# Patient Record
Sex: Female | Born: 1945 | Race: White | Hispanic: No | Marital: Married | Smoking: Never smoker
Health system: Southern US, Community
[De-identification: ages and names within clinical notes are randomized; demographics above are authoritative.]

## PROBLEM LIST (undated history)

## (undated) DIAGNOSIS — G43909 Migraine, unspecified, not intractable, without status migrainosus: Secondary | ICD-10-CM

## (undated) DIAGNOSIS — H269 Unspecified cataract: Secondary | ICD-10-CM

## (undated) DIAGNOSIS — K635 Polyp of colon: Secondary | ICD-10-CM

## (undated) DIAGNOSIS — F32A Depression, unspecified: Secondary | ICD-10-CM

## (undated) DIAGNOSIS — I10 Essential (primary) hypertension: Secondary | ICD-10-CM

## (undated) DIAGNOSIS — G4700 Insomnia, unspecified: Secondary | ICD-10-CM

## (undated) DIAGNOSIS — M797 Fibromyalgia: Secondary | ICD-10-CM

## (undated) DIAGNOSIS — F329 Major depressive disorder, single episode, unspecified: Secondary | ICD-10-CM

## (undated) HISTORY — PX: COLONOSCOPY: SHX174

## (undated) HISTORY — DX: Depression, unspecified: F32.A

## (undated) HISTORY — DX: Unspecified cataract: H26.9

## (undated) HISTORY — DX: Fibromyalgia: M79.7

## (undated) HISTORY — PX: OOPHORECTOMY: SHX86

## (undated) HISTORY — PX: APPENDECTOMY: SHX54

## (undated) HISTORY — DX: Migraine, unspecified, not intractable, without status migrainosus: G43.909

## (undated) HISTORY — DX: Insomnia, unspecified: G47.00

## (undated) HISTORY — DX: Major depressive disorder, single episode, unspecified: F32.9

## (undated) HISTORY — PX: CATARACT EXTRACTION, BILATERAL: SHX1313

---

## 1977-07-14 HISTORY — PX: ABDOMINAL HYSTERECTOMY: SHX81

## 1999-07-01 ENCOUNTER — Encounter: Payer: Self-pay | Admitting: Family Medicine

## 1999-07-01 ENCOUNTER — Encounter: Admission: RE | Admit: 1999-07-01 | Discharge: 1999-07-01 | Payer: Self-pay | Admitting: Family Medicine

## 2000-11-26 ENCOUNTER — Encounter: Admission: RE | Admit: 2000-11-26 | Discharge: 2000-11-26 | Payer: Self-pay | Admitting: Family Medicine

## 2000-11-26 ENCOUNTER — Encounter: Payer: Self-pay | Admitting: Family Medicine

## 2002-07-14 HISTORY — PX: BLADDER SUSPENSION: SHX72

## 2002-11-21 ENCOUNTER — Encounter: Payer: Self-pay | Admitting: Family Medicine

## 2002-11-21 ENCOUNTER — Encounter: Admission: RE | Admit: 2002-11-21 | Discharge: 2002-11-21 | Payer: Self-pay | Admitting: Family Medicine

## 2004-06-17 ENCOUNTER — Encounter (INDEPENDENT_AMBULATORY_CARE_PROVIDER_SITE_OTHER): Payer: Self-pay | Admitting: Specialist

## 2004-06-17 ENCOUNTER — Ambulatory Visit (HOSPITAL_COMMUNITY): Admission: RE | Admit: 2004-06-17 | Discharge: 2004-06-17 | Payer: Self-pay | Admitting: Gastroenterology

## 2004-07-02 ENCOUNTER — Ambulatory Visit (HOSPITAL_COMMUNITY): Admission: RE | Admit: 2004-07-02 | Discharge: 2004-07-02 | Payer: Self-pay | Admitting: Family Medicine

## 2006-01-26 ENCOUNTER — Ambulatory Visit (HOSPITAL_COMMUNITY): Admission: RE | Admit: 2006-01-26 | Discharge: 2006-01-26 | Payer: Self-pay | Admitting: Family Medicine

## 2008-12-06 ENCOUNTER — Emergency Department (HOSPITAL_COMMUNITY): Admission: EM | Admit: 2008-12-06 | Discharge: 2008-12-06 | Payer: Self-pay | Admitting: Emergency Medicine

## 2009-03-14 ENCOUNTER — Encounter: Payer: Self-pay | Admitting: Women's Health

## 2009-03-14 ENCOUNTER — Other Ambulatory Visit: Admission: RE | Admit: 2009-03-14 | Discharge: 2009-03-14 | Payer: Self-pay | Admitting: Obstetrics and Gynecology

## 2009-03-14 ENCOUNTER — Ambulatory Visit (HOSPITAL_COMMUNITY): Admission: RE | Admit: 2009-03-14 | Discharge: 2009-03-14 | Payer: Self-pay | Admitting: Family Medicine

## 2009-03-14 ENCOUNTER — Ambulatory Visit: Payer: Self-pay | Admitting: Women's Health

## 2009-07-23 ENCOUNTER — Ambulatory Visit: Payer: Self-pay | Admitting: Women's Health

## 2010-08-03 ENCOUNTER — Encounter: Payer: Self-pay | Admitting: Family Medicine

## 2010-08-04 ENCOUNTER — Encounter: Payer: Self-pay | Admitting: Family Medicine

## 2010-10-22 LAB — POCT CARDIAC MARKERS
CKMB, poc: <1 ng/mL — ABNORMAL LOW (ref 1.0–8.0)
Myoglobin, poc: 41.5 ng/mL (ref 12–200)

## 2010-10-22 LAB — DIFFERENTIAL
Basophils Absolute: 0 10*3/uL (ref 0.0–0.1)
Lymphocytes Relative: 33 % (ref 12–46)
Monocytes Absolute: 0.7 10*3/uL (ref 0.1–1.0)
Neutro Abs: 4 10*3/uL (ref 1.7–7.7)

## 2010-10-22 LAB — CK TOTAL AND CKMB (NOT AT ARMC)
Relative Index: INVALID (ref 0.0–2.5)
Total CK: 81 U/L (ref 7–177)

## 2010-10-22 LAB — POCT I-STAT, CHEM 8
BUN: 15 mg/dL (ref 6–23)
Chloride: 104 mEq/L (ref 96–112)
Creatinine, Ser: 1.3 mg/dL — ABNORMAL HIGH (ref 0.4–1.2)
Potassium: 4 mEq/L (ref 3.5–5.1)
Sodium: 139 mEq/L (ref 135–145)

## 2010-10-22 LAB — D-DIMER, QUANTITATIVE: D-Dimer, Quant: 0.3 ug/mL-FEU (ref 0.00–0.48)

## 2010-10-22 LAB — HEPATIC FUNCTION PANEL
AST: 48 U/L — ABNORMAL HIGH (ref 0–37)
Albumin: 3.5 g/dL (ref 3.5–5.2)

## 2010-10-22 LAB — CBC
HCT: 43.7 % (ref 36.0–46.0)
Hemoglobin: 15.1 g/dL — ABNORMAL HIGH (ref 12.0–15.0)
MCV: 91.4 fL (ref 78.0–100.0)
Platelets: 231 10*3/uL (ref 150–400)
WBC: 7.1 10*3/uL (ref 4.0–10.5)
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
## 2010-11-29 NOTE — Op Note (Signed)
NAMEJAIANNA, Patricia Orozco              ACCOUNT NO.:  [id number]   MEDICAL RECORD NO.:  [id number]          PATIENT TYPE:  AMB   LOCATION:  ENDO                         FACILITY:  [facility]   PHYSICIAN:  Danise Edge, M.D.   DATE OF BIRTH:  1946/05/04   DATE OF PROCEDURE:  06/17/2004  DATE OF DISCHARGE:                                 OPERATIVE REPORT   PROCEDURE:  Colonoscopy with polypectomy.   INDICATIONS FOR PROCEDURE:  Patricia Orozco is a 65 year old female born  on 08-18-1945.  Patricia Orozco is scheduled to undergo her first  screening colonoscopy with polypectomy to prevent colon cancer.   ENDOSCOPIST:  Danise Edge, M.D.   PREMEDICATION:  Versed 9.5 mg, Demerol 90 mg.   PROCEDURE:  After obtaining informed consent, Patricia Orozco was placed in the  left lateral decubitus position.  I administered intravenous Demerol and  intravenous Versed to achieve conscious sedation for the procedure.  The  patient's blood pressure, oxygen saturation, and cardiac rhythm were  monitored throughout the procedure and documented in the medical record.   Anal inspection and digital rectal exam was normal.  The Olympus adjustable  pediatric colonoscope was introduced into the rectum and advanced to the  cecum.  Colonic preparation for the exam today was excellent.   RECTUM:  Normal.   SIGMOID COLON/DESCENDING COLON:  Normal.   SPLENIC FLEXURE:  Normal.   TRANSVERSE COLON:  Normal.   HEPATIC FLEXURE:  Normal.   ASCENDING COLON:  From the proximal ascending colon, a 2 mm sessile polyp  was removed with electrocautery snare.   CECUM AND ILEOCECAL VALVE:  Normal.   ASSESSMENT:  A small polyp was removed from the proximal ascending colon  with electrocautery snare, otherwise normal screening proctocolonoscopy of  the cecum.   RECOMMENDATIONS:  If the polyp returns neoplastic pathologically, [name] should undergo a repeat colonoscopy in five years.      MJ/MEDQ  D:   06/17/2004  T:  06/17/2004  Job:  [id number]   cc:   Leonette Most Record  [address]  [place]  [place] [address]  Fax: [phone]

## 2011-03-12 ENCOUNTER — Ambulatory Visit (INDEPENDENT_AMBULATORY_CARE_PROVIDER_SITE_OTHER): Payer: Medicare Other | Admitting: Women's Health

## 2011-03-12 ENCOUNTER — Encounter: Payer: Self-pay | Admitting: Women's Health

## 2011-03-12 ENCOUNTER — Other Ambulatory Visit (HOSPITAL_COMMUNITY)
Admission: RE | Admit: 2011-03-12 | Discharge: 2011-03-12 | Disposition: A | Discharge status: Home / Self Care | Payer: Medicare Other | Source: Ambulatory Visit | Attending: Gynecology | Admitting: Gynecology

## 2011-03-12 VITALS — BP 140/70 | Ht 64.0 in | Wt 163.0 lb

## 2011-03-12 DIAGNOSIS — F329 Major depressive disorder, single episode, unspecified: Secondary | ICD-10-CM

## 2011-03-12 DIAGNOSIS — G43909 Migraine, unspecified, not intractable, without status migrainosus: Secondary | ICD-10-CM | POA: Insufficient documentation

## 2011-03-12 DIAGNOSIS — R109 Unspecified abdominal pain: Secondary | ICD-10-CM

## 2011-03-12 DIAGNOSIS — F32A Depression, unspecified: Secondary | ICD-10-CM

## 2011-03-12 DIAGNOSIS — N809 Endometriosis, unspecified: Secondary | ICD-10-CM | POA: Insufficient documentation

## 2011-03-12 DIAGNOSIS — Z01419 Encounter for gynecological examination (general) (routine) without abnormal findings: Secondary | ICD-10-CM | POA: Insufficient documentation

## 2011-03-12 DIAGNOSIS — F418 Other specified anxiety disorders: Secondary | ICD-10-CM | POA: Insufficient documentation

## 2011-03-12 DIAGNOSIS — M797 Fibromyalgia: Secondary | ICD-10-CM | POA: Insufficient documentation

## 2011-03-12 DIAGNOSIS — Z13820 Encounter for screening for osteoporosis: Secondary | ICD-10-CM

## 2011-03-12 DIAGNOSIS — Z124 Encounter for screening for malignant neoplasm of cervix: Secondary | ICD-10-CM

## 2011-03-12 DIAGNOSIS — G4700 Insomnia, unspecified: Secondary | ICD-10-CM | POA: Insufficient documentation

## 2011-03-12 NOTE — Progress Notes (Signed)
CLAUDETT BAYLY [birth year] [id number]    History:    The patient presents for annual exam.  Past medical history, past surgical history, family history and social history were all reviewed and documented in the EPIC chart.   ROS:  A  ROS was performed and pertinent positives and negatives are included in the history.  Exam:  Filed Vitals:   03/12/11 1529  BP: 140/70    General appearance:  Normal Head/Neck:  Normal, without cervical or supraclavicular adenopathy. Thyroid:  Symmetrical, normal in size, without palpable masses or nodularity. Respiratory  Effort:  Normal  Auscultation:  Clear without wheezing or rhonchi Cardiovascular  Auscultation:  Regular rate, without rubs, murmurs or gallops  Edema/varicosities:  Not grossly evident Abdominal  Soft,nontender, without masses, guarding or rebound.  Liver/spleen:  No organomegaly noted  Hernia:  None appreciated  Skin  Inspection:  Grossly normal  Palpation:  Grossly normal Neurologic/psychiatric  Orientation:  Normal with appropriate conversation.  Mood/affect:  Normal  Genitourinary    Breasts: Examined lying and sitting.     Right: Without masses, retractions, discharge or axillary adenopathy.     Left: Without masses, retractions, discharge or axillary adenopathy.   Inguinal/mons:  Normal without inguinal adenopathy  External genitalia:  Normal  BUS/Urethra/Skene's glands:  Normal  Bladder:  Normal  Vagina:  atrophic  Cervix:    Uterus: Absent  Adnexa/parametria:     Rt: Without masses or tenderness.   Lt: Without masses or tenderness.  Anus and perineum: Normal  Digital rectal exam: Normal sphincter tone without palpated masses or tenderness  Assessment/Plan:  65 y.o. MWF G1P51for annual exam.  Hysterectomy with BSO for endometriosis at age 46.Not sexually active. Presents for a Pap and problem. States having  problems with lower abdominal pain, for 2-3 weeks, intermittent, dull ache.  Denies constipation,  urinary pain or frequency, discharge,nausea, vomiting, fever, denies change in diet or routine.History of a normal colonoscopy several years ago. She does have numerous moles and did encourage her to followup with her dermatologist, she states she will get that scheduled. No labs or medications, she gets those at her primary care . She was seen at  her primary care last week, UA was negative, and she also had numerous labs drawn that were all normal(copy brought in and reviewed)  Lower abdominal  pain of unknown origin.  Instructed to followup with her primary care if her pain would persist, encouraged a bland diet, did review the pain is probably not related to a GYN issue Pap only today encouraged SBEs, annual mammogram which have been normal. Dexa, which states was normal with primary care. Zostovac, Pneumovax, and flu vaccine were encouraged.    Harrington Challenger [facility], 5:23 PM 03/12/2011

## 2011-03-13 NOTE — Progress Notes (Signed)
Addended by: Harrington Challenger on: 03/13/2011 06:49 PM   Modules accepted: Orders

## 2011-03-18 ENCOUNTER — Telehealth: Payer: Self-pay | Admitting: Women's Health

## 2011-03-18 NOTE — Telephone Encounter (Signed)
Telephone call from pt.  States received both pnuemovac and zostovac  vaccines last year  (2011) but has not had a bone density.  Will schedule dexa here at [facility].

## 2011-05-23 ENCOUNTER — Emergency Department (HOSPITAL_BASED_OUTPATIENT_CLINIC_OR_DEPARTMENT_OTHER)
Admission: EM | Admit: 2011-05-23 | Discharge: 2011-05-23 | Disposition: A | Discharge status: Home / Self Care | Payer: Medicare Other | Attending: Emergency Medicine | Admitting: Emergency Medicine

## 2011-05-23 ENCOUNTER — Encounter (HOSPITAL_BASED_OUTPATIENT_CLINIC_OR_DEPARTMENT_OTHER): Payer: Self-pay | Admitting: *Deleted

## 2011-05-23 ENCOUNTER — Emergency Department (INDEPENDENT_AMBULATORY_CARE_PROVIDER_SITE_OTHER): Payer: Medicare Other

## 2011-05-23 DIAGNOSIS — Z79899 Other long term (current) drug therapy: Secondary | ICD-10-CM | POA: Insufficient documentation

## 2011-05-23 DIAGNOSIS — M62838 Other muscle spasm: Secondary | ICD-10-CM | POA: Insufficient documentation

## 2011-05-23 DIAGNOSIS — R51 Headache: Secondary | ICD-10-CM | POA: Insufficient documentation

## 2011-05-23 DIAGNOSIS — F329 Major depressive disorder, single episode, unspecified: Secondary | ICD-10-CM | POA: Insufficient documentation

## 2011-05-23 DIAGNOSIS — G319 Degenerative disease of nervous system, unspecified: Secondary | ICD-10-CM | POA: Insufficient documentation

## 2011-05-23 DIAGNOSIS — IMO0001 Reserved for inherently not codable concepts without codable children: Secondary | ICD-10-CM | POA: Insufficient documentation

## 2011-05-23 DIAGNOSIS — I6789 Other cerebrovascular disease: Secondary | ICD-10-CM

## 2011-05-23 DIAGNOSIS — F3289 Other specified depressive episodes: Secondary | ICD-10-CM | POA: Insufficient documentation

## 2011-05-23 MED ORDER — HYDROMORPHONE HCL PF 1 MG/ML IJ SOLN
1.0000 mg | Freq: Once | INTRAMUSCULAR | Status: AC
  Administered 2011-05-23: 1 mg via INTRAVENOUS
  Filled 2011-05-23: qty 1

## 2011-05-23 MED ORDER — METOCLOPRAMIDE HCL 5 MG/ML IJ SOLN
10.0000 mg | Freq: Once | INTRAMUSCULAR | Status: AC
  Administered 2011-05-23: 10 mg via INTRAVENOUS
  Filled 2011-05-23: qty 2

## 2011-05-23 MED ORDER — SODIUM CHLORIDE 0.9 % IV SOLN
Freq: Once | INTRAVENOUS | Status: AC
  Administered 2011-05-23: 15:00:00 via INTRAVENOUS

## 2011-05-23 MED ORDER — METHOCARBAMOL 500 MG PO TABS: 500.0000 mg | ORAL_TABLET | Freq: Two times a day (BID) | ORAL | Status: AC

## 2011-05-23 MED ORDER — HYDROCODONE-ACETAMINOPHEN 5-325 MG PO TABS: 2.0000 | ORAL_TABLET | ORAL | Status: AC | PRN

## 2011-05-23 MED ORDER — DIAZEPAM 5 MG/ML IJ SOLN
5.0000 mg | Freq: Once | INTRAMUSCULAR | Status: AC
  Administered 2011-05-23: 5 mg via INTRAVENOUS
  Filled 2011-05-23: qty 2

## 2011-05-23 MED ORDER — DIPHENHYDRAMINE HCL 50 MG/ML IJ SOLN
12.5000 mg | Freq: Once | INTRAMUSCULAR | Status: AC
  Administered 2011-05-23: 12.5 mg via INTRAVENOUS
  Filled 2011-05-23: qty 1

## 2011-05-23 NOTE — ED Notes (Signed)
Pt awoke yesterday with a migraine that has not responded to her meds or a massage. Pt sts this is the worst HA she's had in 25 years. Pt also c/o N/V.

## 2011-05-23 NOTE — ED Provider Notes (Signed)
History     CSN: [id number] Arrival date & time: 05/23/2011  2:21 PM   First MD Initiated Contact with Patient 05/23/11 1437      Chief Complaint  Patient presents with  . Migraine    (Consider location/radiation/quality/duration/timing/severity/associated sxs/prior treatment) Patient is a 65 y.o. female presenting with headaches. The history is provided by the patient. No language interpreter was used.  Headache  This is a new problem. The current episode started yesterday. The problem occurs constantly. The problem has not changed since onset.The headache is associated with nothing. The pain is located in the bilateral region. The quality of the pain is described as sharp. The pain is at a severity of 10/10. The pain is severe. The pain radiates to the left neck. She has tried triptan therapy and cold packs for the symptoms. The treatment provided no relief.  Pt complains of soreness in her left neck and a headache.  Pt reports she has a history of headaches.  Pt took imitrex yesterday without relief.  Pt reports it hurt to tun neck.  Pt had a sinus headache 3 weeks ago.  Pt reports she received an antibiotic shot and a cortisone shot.   Pt went for a massage to try to help before coming here.  Pt reports no relief from muscle tension or headaache.  Past Medical History  Diagnosis Date  . Fibromyalgia   . Depression   . Insomnia   . Migraines   . Endometriosis     stage 4    Past Surgical History  Procedure Date  . Abdominal hysterectomy 1979    endometrosis  . Bladder suspension 2004  . Oophorectomy   . Appendectomy     Family History  Problem Relation Age of Onset  . Hypertension Mother   . Heart disease Mother     History  Substance Use Topics  . Smoking status: Never Smoker   . Smokeless tobacco: Never Used  . Alcohol Use: No    OB History    Grav Para Term Preterm Abortions TAB SAB Ect Mult Living   1 1        1       Review of Systems  Musculoskeletal:  Positive for back pain.  Neurological: Positive for headaches. Negative for dizziness, weakness, light-headedness and numbness.  All other systems reviewed and are negative.    Allergies  Celebrex  Home Medications   Current Outpatient Rx  Name Route Sig Dispense Refill  . CALCIUM MAGNESIUM PO Oral Take by mouth.      Marland Kitchen KLONOPIN PO Oral Take by mouth as needed.      . DULOXETINE HCL 20 MG PO CPEP Oral Take 60 mg by mouth daily.     Marland Kitchen FISH OIL 1000 MG PO CAPS Oral Take by mouth 2 (two) times daily.      Marland Kitchen OVER THE COUNTER MEDICATION  Baby asprin 81mg      . IMITREX PO Oral Take by mouth.      Marland Kitchen VITAMIN E 400 UNITS PO CAPS Oral Take 400 Units by mouth 2 (two) times daily.      Marland Kitchen ZOLPIDEM TARTRATE 10 MG PO TABS Oral Take 10 mg by mouth at bedtime as needed.        BP 161/76  Pulse 70  Temp(Src) 97.9 F (36.6 C) (Oral)  Resp 18  SpO2 97%  LMP 03/11/1978  Physical Exam  Nursing note and vitals reviewed. Constitutional: She is oriented to person, place, and  time. She appears well-developed and well-nourished.  HENT:  Head: Normocephalic and atraumatic.  Right Ear: External ear normal.  Left Ear: External ear normal.  Mouth/Throat: Oropharynx is clear and moist.  Eyes: Conjunctivae and EOM are normal. Pupils are equal, round, and reactive to light.  Neck: Normal range of motion.  Cardiovascular: Normal rate and normal heart sounds.   Pulmonary/Chest: Effort normal.  Abdominal: Soft.  Musculoskeletal: Normal range of motion.  Neurological: She is alert and oriented to person, place, and time. She has normal reflexes.  Skin: Skin is warm.  Psychiatric: She has a normal mood and affect.    ED Course  Procedures (including critical care time)  Labs Reviewed - No data to display Ct Head Wo Contrast  05/23/2011  *RADIOLOGY REPORT*  Clinical Data: Worst headache of life, light sensitive, migraine  CT HEAD WITHOUT CONTRAST  Technique:  Contiguous axial images were obtained from  the base of the skull through the vertex without contrast.  Comparison: 12/06/2008  Findings: Minimal age-related atrophy. Normal ventricular morphology. No midline shift or mass effect. Scattered small vessel chronic ischemic changes of deep cerebral white matter, stable. No intracranial hemorrhage, mass lesion or evidence of acute infarction. Visualized paranasal sinuses and mastoid air cells clear. No acute osseous findings.  IMPRESSION: Small vessel chronic ischemic changes of deep cerebral white matter. No acute intracranial abnormalities.  Original Report Authenticated By: Lollie Marrow, M.D.     No diagnosis found.    MDM  IV Ns x 999,  Pt given torodol, reglan and benadryl  Head Ct is normal.   Dr. Anitra Lauth in to see,  Pt given dilaudid and valium.  Pt has some decreased spasm left neck.  No evidence of cva,  Headache seems more tension, muscular,  Neck muscle spasm,   I do not think pt has SAH.   Pt advised to see her MD on Monday.        Langston Masker, [place] 05/23/11 [phone]

## 2011-05-24 NOTE — ED Provider Notes (Signed)
Medical screening examination/treatment/procedure(s) were performed by non-physician practitioner and as supervising physician I was immediately available for consultation/collaboration.   Celene Kras, MD 05/24/11 [phone]

## 2013-04-27 ENCOUNTER — Other Ambulatory Visit (HOSPITAL_COMMUNITY): Payer: Self-pay | Admitting: Bariatrics

## 2013-04-27 DIAGNOSIS — Z1231 Encounter for screening mammogram for malignant neoplasm of breast: Secondary | ICD-10-CM

## 2013-05-09 ENCOUNTER — Ambulatory Visit (HOSPITAL_COMMUNITY): Payer: Medicare Other

## 2013-05-23 ENCOUNTER — Ambulatory Visit (HOSPITAL_COMMUNITY): Payer: Medicare Other

## 2014-05-15 ENCOUNTER — Encounter (HOSPITAL_BASED_OUTPATIENT_CLINIC_OR_DEPARTMENT_OTHER): Payer: Self-pay | Admitting: *Deleted

## 2016-09-11 ENCOUNTER — Other Ambulatory Visit: Payer: Self-pay | Admitting: Family Medicine

## 2016-09-11 DIAGNOSIS — Z1231 Encounter for screening mammogram for malignant neoplasm of breast: Secondary | ICD-10-CM

## 2016-09-30 ENCOUNTER — Ambulatory Visit: Payer: Self-pay

## 2016-10-16 ENCOUNTER — Ambulatory Visit: Payer: Self-pay

## 2017-06-24 ENCOUNTER — Ambulatory Visit (HOSPITAL_COMMUNITY): Payer: Self-pay | Admitting: Psychiatry

## 2017-08-05 ENCOUNTER — Ambulatory Visit (HOSPITAL_COMMUNITY): Payer: Self-pay | Admitting: Psychiatry

## 2017-10-24 ENCOUNTER — Emergency Department (HOSPITAL_COMMUNITY): Payer: Medicare HMO

## 2017-10-24 ENCOUNTER — Other Ambulatory Visit: Payer: Self-pay

## 2017-10-24 ENCOUNTER — Observation Stay (HOSPITAL_COMMUNITY): Payer: Medicare HMO

## 2017-10-24 ENCOUNTER — Observation Stay (HOSPITAL_COMMUNITY)
Admission: EM | Admit: 2017-10-24 | Discharge: 2017-10-26 | Disposition: A | Discharge status: Home / Self Care | Payer: Medicare HMO | Attending: Internal Medicine | Admitting: Internal Medicine

## 2017-10-24 ENCOUNTER — Encounter (HOSPITAL_COMMUNITY): Payer: Self-pay | Admitting: Emergency Medicine

## 2017-10-24 DIAGNOSIS — R531 Weakness: Secondary | ICD-10-CM | POA: Insufficient documentation

## 2017-10-24 DIAGNOSIS — Z961 Presence of intraocular lens: Secondary | ICD-10-CM | POA: Insufficient documentation

## 2017-10-24 DIAGNOSIS — G4700 Insomnia, unspecified: Secondary | ICD-10-CM | POA: Diagnosis not present

## 2017-10-24 DIAGNOSIS — R42 Dizziness and giddiness: Secondary | ICD-10-CM

## 2017-10-24 DIAGNOSIS — I10 Essential (primary) hypertension: Secondary | ICD-10-CM | POA: Insufficient documentation

## 2017-10-24 DIAGNOSIS — E861 Hypovolemia: Secondary | ICD-10-CM | POA: Diagnosis not present

## 2017-10-24 DIAGNOSIS — Z90710 Acquired absence of both cervix and uterus: Secondary | ICD-10-CM | POA: Insufficient documentation

## 2017-10-24 DIAGNOSIS — G43909 Migraine, unspecified, not intractable, without status migrainosus: Secondary | ICD-10-CM | POA: Insufficient documentation

## 2017-10-24 DIAGNOSIS — Z9841 Cataract extraction status, right eye: Secondary | ICD-10-CM | POA: Insufficient documentation

## 2017-10-24 DIAGNOSIS — Z86010 Personal history of colonic polyps: Secondary | ICD-10-CM | POA: Insufficient documentation

## 2017-10-24 DIAGNOSIS — N179 Acute kidney failure, unspecified: Secondary | ICD-10-CM | POA: Diagnosis not present

## 2017-10-24 DIAGNOSIS — R4701 Aphasia: Secondary | ICD-10-CM | POA: Diagnosis not present

## 2017-10-24 DIAGNOSIS — Z9842 Cataract extraction status, left eye: Secondary | ICD-10-CM | POA: Diagnosis not present

## 2017-10-24 DIAGNOSIS — M797 Fibromyalgia: Secondary | ICD-10-CM | POA: Diagnosis not present

## 2017-10-24 DIAGNOSIS — Z881 Allergy status to other antibiotic agents status: Secondary | ICD-10-CM | POA: Diagnosis not present

## 2017-10-24 DIAGNOSIS — N289 Disorder of kidney and ureter, unspecified: Secondary | ICD-10-CM | POA: Diagnosis not present

## 2017-10-24 DIAGNOSIS — Z8249 Family history of ischemic heart disease and other diseases of the circulatory system: Secondary | ICD-10-CM | POA: Diagnosis not present

## 2017-10-24 DIAGNOSIS — Z79899 Other long term (current) drug therapy: Secondary | ICD-10-CM | POA: Diagnosis not present

## 2017-10-24 DIAGNOSIS — J8410 Pulmonary fibrosis, unspecified: Secondary | ICD-10-CM | POA: Insufficient documentation

## 2017-10-24 DIAGNOSIS — F418 Other specified anxiety disorders: Secondary | ICD-10-CM

## 2017-10-24 DIAGNOSIS — I071 Rheumatic tricuspid insufficiency: Secondary | ICD-10-CM | POA: Diagnosis not present

## 2017-10-24 DIAGNOSIS — R55 Syncope and collapse: Principal | ICD-10-CM | POA: Insufficient documentation

## 2017-10-24 DIAGNOSIS — Z7982 Long term (current) use of aspirin: Secondary | ICD-10-CM | POA: Diagnosis not present

## 2017-10-24 DIAGNOSIS — Z886 Allergy status to analgesic agent status: Secondary | ICD-10-CM | POA: Insufficient documentation

## 2017-10-24 DIAGNOSIS — R079 Chest pain, unspecified: Secondary | ICD-10-CM | POA: Insufficient documentation

## 2017-10-24 HISTORY — DX: Polyp of colon: K63.5

## 2017-10-24 HISTORY — DX: Essential (primary) hypertension: I10

## 2017-10-24 LAB — COMPREHENSIVE METABOLIC PANEL
ALBUMIN: 3.5 g/dL (ref 3.5–5.0)
ALK PHOS: 73 U/L (ref 38–126)
ALT: 32 U/L (ref 14–54)
AST: 30 U/L (ref 15–41)
Anion gap: 12 (ref 5–15)
BILIRUBIN TOTAL: 0.8 mg/dL (ref 0.3–1.2)
BUN: 29 mg/dL — AB (ref 6–20)
CALCIUM: 9.4 mg/dL (ref 8.9–10.3)
CO2: 22 mmol/L (ref 22–32)
CREATININE: 1.35 mg/dL — AB (ref 0.44–1.00)
Chloride: 100 mmol/L — ABNORMAL LOW (ref 101–111)
GFR calc Af Amer: 44 mL/min — ABNORMAL LOW (ref >60)
GFR calc non Af Amer: 38 mL/min — ABNORMAL LOW (ref >60)
GLUCOSE: 96 mg/dL (ref 65–99)
Potassium: 3.7 mmol/L (ref 3.5–5.1)
Sodium: 134 mmol/L — ABNORMAL LOW (ref 135–145)
TOTAL PROTEIN: 6.1 g/dL — AB (ref 6.5–8.1)

## 2017-10-24 LAB — URINALYSIS, ROUTINE W REFLEX MICROSCOPIC
BACTERIA UA: NONE SEEN
BILIRUBIN URINE: NEGATIVE
Glucose, UA: NEGATIVE mg/dL
Hgb urine dipstick: NEGATIVE
Ketones, ur: 20 mg/dL — AB
Nitrite: NEGATIVE
Protein, ur: NEGATIVE mg/dL
SPECIFIC GRAVITY, URINE: 1.011 (ref 1.005–1.030)
pH: 7 (ref 5.0–8.0)

## 2017-10-24 LAB — RAPID URINE DRUG SCREEN, HOSP PERFORMED
AMPHETAMINES: NOT DETECTED
Barbiturates: NOT DETECTED
Benzodiazepines: NOT DETECTED
COCAINE: NOT DETECTED
OPIATES: NOT DETECTED
TETRAHYDROCANNABINOL: NOT DETECTED

## 2017-10-24 LAB — CBC WITH DIFFERENTIAL/PLATELET
BASOS ABS: 0 10*3/uL (ref 0.0–0.1)
BASOS PCT: 0 %
Eosinophils Absolute: 0.1 10*3/uL (ref 0.0–0.7)
Eosinophils Relative: 1 %
HEMATOCRIT: 45.9 % (ref 36.0–46.0)
HEMOGLOBIN: 16.2 g/dL — AB (ref 12.0–15.0)
LYMPHS PCT: 29 %
Lymphs Abs: 3.5 10*3/uL (ref 0.7–4.0)
MCH: 31.8 pg (ref 26.0–34.0)
MCHC: 35.3 g/dL (ref 30.0–36.0)
MCV: 90.2 fL (ref 78.0–100.0)
MONOS PCT: 6 %
Monocytes Absolute: 0.8 10*3/uL (ref 0.1–1.0)
NEUTROS ABS: 7.5 10*3/uL (ref 1.7–7.7)
NEUTROS PCT: 64 %
Platelets: 233 10*3/uL (ref 150–400)
RBC: 5.09 MIL/uL (ref 3.87–5.11)
RDW: 12.5 % (ref 11.5–15.5)
WBC: 11.9 10*3/uL — ABNORMAL HIGH (ref 4.0–10.5)

## 2017-10-24 LAB — I-STAT TROPONIN, ED: Troponin i, poc: 0.01 ng/mL (ref 0.00–0.08)

## 2017-10-24 LAB — CBG MONITORING, ED: Glucose-Capillary: 67 mg/dL (ref 65–99)

## 2017-10-24 MED ORDER — LORAZEPAM 0.5 MG PO TABS
0.5000 mg | ORAL_TABLET | Freq: Four times a day (QID) | ORAL | Status: DC | PRN
  Administered 2017-10-25: 0.5 mg via ORAL
  Filled 2017-10-24: qty 1

## 2017-10-24 MED ORDER — ONDANSETRON HCL 4 MG/2ML IJ SOLN
4.0000 mg | Freq: Once | INTRAMUSCULAR | Status: AC
  Administered 2017-10-24: 4 mg via INTRAVENOUS
  Filled 2017-10-24: qty 2

## 2017-10-24 MED ORDER — ASPIRIN 81 MG PO CHEW: 324.0000 mg | CHEWABLE_TABLET | Freq: Once | ORAL | Status: DC

## 2017-10-24 MED ORDER — SODIUM CHLORIDE 0.9 % IV BOLUS
1000.0000 mL | Freq: Once | INTRAVENOUS | Status: AC
  Administered 2017-10-24: 1000 mL via INTRAVENOUS

## 2017-10-24 MED ORDER — DULOXETINE HCL 60 MG PO CPEP
60.0000 mg | ORAL_CAPSULE | Freq: Every day | ORAL | Status: DC
  Administered 2017-10-24 – 2017-10-26 (×3): 60 mg via ORAL
  Filled 2017-10-24 (×3): qty 1

## 2017-10-24 MED ORDER — SODIUM CHLORIDE 0.9% FLUSH
3.0000 mL | Freq: Two times a day (BID) | INTRAVENOUS | Status: DC
  Administered 2017-10-24 – 2017-10-26 (×3): 3 mL via INTRAVENOUS

## 2017-10-24 MED ORDER — ASPIRIN 81 MG PO CHEW
81.0000 mg | CHEWABLE_TABLET | Freq: Every day | ORAL | Status: DC
  Administered 2017-10-25 – 2017-10-26 (×2): 81 mg via ORAL
  Filled 2017-10-24 (×2): qty 1

## 2017-10-24 MED ORDER — HEPARIN SODIUM (PORCINE) 5000 UNIT/ML IJ SOLN
5000.0000 [IU] | Freq: Three times a day (TID) | INTRAMUSCULAR | Status: DC
  Administered 2017-10-24 – 2017-10-26 (×5): 5000 [IU] via SUBCUTANEOUS
  Filled 2017-10-24 (×5): qty 1

## 2017-10-24 MED ORDER — LORAZEPAM 2 MG/ML IJ SOLN
1.0000 mg | Freq: Once | INTRAMUSCULAR | Status: AC
  Administered 2017-10-24: 1 mg via INTRAVENOUS
  Filled 2017-10-24: qty 1

## 2017-10-24 MED ORDER — ONDANSETRON HCL 4 MG PO TABS: 4.0000 mg | ORAL_TABLET | Freq: Four times a day (QID) | ORAL | Status: DC | PRN

## 2017-10-24 MED ORDER — ACETAMINOPHEN 325 MG PO TABS
650.0000 mg | ORAL_TABLET | Freq: Four times a day (QID) | ORAL | Status: DC | PRN
  Administered 2017-10-25: 650 mg via ORAL
  Filled 2017-10-24: qty 2

## 2017-10-24 MED ORDER — HYDROCODONE-ACETAMINOPHEN 5-325 MG PO TABS
1.0000 | ORAL_TABLET | ORAL | Status: DC | PRN
  Administered 2017-10-26: 2 via ORAL
  Filled 2017-10-24: qty 2

## 2017-10-24 MED ORDER — ONDANSETRON HCL 4 MG/2ML IJ SOLN: 4.0000 mg | Freq: Four times a day (QID) | INTRAMUSCULAR | Status: DC | PRN

## 2017-10-24 MED ORDER — ACETAMINOPHEN 650 MG RE SUPP: 650.0000 mg | Freq: Four times a day (QID) | RECTAL | Status: DC | PRN

## 2017-10-24 MED ORDER — OMEGA-3-ACID ETHYL ESTERS 1 G PO CAPS
1.0000 g | ORAL_CAPSULE | Freq: Two times a day (BID) | ORAL | Status: DC
  Administered 2017-10-25 – 2017-10-26 (×3): 1 g via ORAL
  Filled 2017-10-24 (×3): qty 1

## 2017-10-24 MED ORDER — ZOLPIDEM TARTRATE 5 MG PO TABS
5.0000 mg | ORAL_TABLET | Freq: Every evening | ORAL | Status: DC | PRN
  Administered 2017-10-24 – 2017-10-25 (×2): 5 mg via ORAL
  Filled 2017-10-24 (×2): qty 1

## 2017-10-24 MED ORDER — SODIUM CHLORIDE 0.9 % IV SOLN
INTRAVENOUS | Status: AC
  Administered 2017-10-24: via INTRAVENOUS

## 2017-10-24 NOTE — ED Triage Notes (Signed)
Per [facility]: Patient to ED c/o near-syncopal episode. Patient and family at side report patient has dealt with these episodes for a while, but have increased in frequency and length the past two weeks. During the episodes, patient becomes dizzy and diaphoretic and states, "I feel like I'm about to pass out." Upon EMS arrival, patient denied CP - en route, she endorsed CP, at which time, she also became nauseated and short of breath. Patient A&O x 4. She had a full work-up per family on Wednesday and state they thought everything came back normal. EMS VS: 130/90, HR 60 NSR, 97% RA, CBG 120. Patient given 324 ASA and 1 SL NTG PTA with relief - denies CP at this time. Resp currently e/u, skin cool all extremities, slightly mottled.

## 2017-10-24 NOTE — ED Notes (Signed)
Family stepped to nurses desk and stated that her mother had a sudden change and needed help. MD was at bedside. RN step in and was there for support. Covered patient up and readjusted patient in the bed. NAD Noted.

## 2017-10-24 NOTE — ED Provider Notes (Addendum)
[facility]   CSN: [id number] Arrival date & time: 10/24/17  1737     History   Chief Complaint Chief Complaint  Patient presents with  . Near Syncope    HPI Patricia Orozco is a 72 y.o. female.  Patient with multiple episodes of dizziness, nausea, diaphoresis, palpitations, near syncope for the last several days.  Patient states some chest pain.  No shortness of breath, possibly some dysuria.  Patient states that symptoms typically resolve on their own with rest.  Patient has been eating and drinking well.  No new medications.  No fever, no cough, no sputum production.  The history is provided by the patient, a caregiver and a relative.  Near Syncope  This is a recurrent problem. The current episode started more than 2 days ago. The problem occurs daily. The problem has been gradually worsening. Associated symptoms include chest pain. Pertinent negatives include no abdominal pain, no headaches and no shortness of breath. Nothing aggravates the symptoms. The symptoms are relieved by rest. She has tried nothing for the symptoms. The treatment provided no relief.    Past Medical History:  Diagnosis Date  . Colon polyp   . Depression   . Endometriosis    stage 4  . Fibromyalgia   . Hypertension   . Insomnia   . Migraines     Patient Active Problem List   Diagnosis Date Noted  . Near syncope 10/24/2017  . Chest pain 10/24/2017  . Expressive aphasia 10/24/2017  . Endometriosis 03/12/2011  . Depression with anxiety 03/12/2011  . Insomnia   . Migraines     Past Surgical History:  Procedure Laterality Date  . ABDOMINAL HYSTERECTOMY  1979   endometrosis  . APPENDECTOMY    . BLADDER SUSPENSION  2004  . COLONOSCOPY    . OOPHORECTOMY       OB History    Gravida  1   Para  1   Term      Preterm      AB      Living  1     SAB      TAB      Ectopic      Multiple      Live Births                Home Medications    Prior to Admission medications   Medication Sig Start Date End Date Taking? Authorizing Provider  Calcium-Magnesium-Vitamin D (CALCIUM MAGNESIUM PO) Take by mouth.      [provider]  ClonazePAM (KLONOPIN PO) Take by mouth as needed.      [provider]  DULoxetine (CYMBALTA) 20 MG capsule Take 60 mg by mouth daily.     [provider]  Omega-3 Fatty Acids (FISH OIL) 1000 MG CAPS Take by mouth 2 (two) times daily.      [provider]  OVER THE COUNTER MEDICATION Baby asprin 81mg      [provider]  SUMAtriptan Succinate (IMITREX PO) Take by mouth.      [provider]  vitamin E (VITAMIN E) 400 UNIT capsule Take 400 Units by mouth 2 (two) times daily.      [provider]  zolpidem (AMBIEN) 10 MG tablet Take 10 mg by mouth at bedtime as needed.      [provider]    Family History Family History  Problem Relation Age of Onset  . Hypertension Mother   .  Heart disease Mother     Social History Social History   Tobacco Use  . Smoking status: Never Smoker  . Smokeless tobacco: Never Used  Substance Use Topics  . Alcohol use: No  . Drug use: No     Allergies   Celebrex [celecoxib]   Review of Systems Review of Systems  Constitutional: Positive for diaphoresis. Negative for chills and fever.  HENT: Negative for ear pain and sore throat.   Eyes: Negative for pain and visual disturbance.  Respiratory: Negative for cough and shortness of breath.   Cardiovascular: Positive for chest pain, palpitations and near-syncope.  Gastrointestinal: Positive for nausea. Negative for abdominal pain and vomiting.  Genitourinary: Positive for difficulty urinating and dysuria. Negative for hematuria.  Musculoskeletal: Negative for arthralgias and back pain.  Skin: Negative for color change and rash.  Neurological: Positive for dizziness, syncope (near) and weakness. Negative for seizures  and headaches.  All other systems reviewed and are negative.    Physical Exam Updated Vital Signs  ED Triage Vitals  Enc Vitals Group     BP 10/24/17 1747 (!) 145/56     Pulse Rate 10/24/17 1747 (!) 58     Resp 10/24/17 1747 18     Temp 10/24/17 1747 97.9 F (36.6 C)     Temp Source 10/24/17 1747 Oral     SpO2 10/24/17 1747 100 %     Weight --      Height --      Head Circumference --      Peak Flow --      Pain Score 10/24/17 1801 0     Pain Loc --      Pain Edu? --      Excl. in GC? --     Physical Exam  Constitutional: She is oriented to person, place, and time. She appears well-developed and well-nourished. She appears distressed.  HENT:  Head: Normocephalic and atraumatic.  Eyes: Pupils are equal, round, and reactive to light. Conjunctivae and EOM are normal.  Neck: Normal range of motion. Neck supple.  Cardiovascular: Normal rate and regular rhythm.  No murmur heard. Pulmonary/Chest: Effort normal and breath sounds normal. No respiratory distress.  Abdominal: Soft. There is no tenderness.  Musculoskeletal: Normal range of motion. She exhibits no edema.  Neurological: She is alert and oriented to person, place, and time. No cranial nerve deficit or sensory deficit. She exhibits normal muscle tone. Coordination normal.  5+/5 strength, normal sensation, no drift  Skin: Skin is warm and dry.  Psychiatric: She has a normal mood and affect.  Nursing note and vitals reviewed.    ED Treatments / Results  Labs (all labs ordered are listed, but only abnormal results are displayed) Labs Reviewed  CBC WITH DIFFERENTIAL/PLATELET - Abnormal; Notable for the following components:      Result Value   WBC 11.9 (*)    Hemoglobin 16.2 (*)    All other components within normal limits  COMPREHENSIVE METABOLIC PANEL - Abnormal; Notable for the following components:   Sodium 134 (*)    Chloride 100 (*)    BUN 29 (*)    Creatinine, Ser 1.35 (*)    Total Protein 6.1 (*)     GFR calc non Af Amer 38 (*)    GFR calc Af Amer 44 (*)    All other components within normal limits  URINALYSIS, ROUTINE W REFLEX MICROSCOPIC - Abnormal; Notable for the following components:   APPearance HAZY (*)  Ketones, ur 20 (*)    Leukocytes, UA SMALL (*)    Squamous Epithelial / LPF 0-5 (*)    All other components within normal limits  RAPID URINE DRUG SCREEN, HOSP PERFORMED  CBG MONITORING, ED  I-STAT TROPONIN, ED    EKG EKG Interpretation  Date/Time:  Saturday October 24 2017 17:47:14 EDT Ventricular Rate:  58 PR Interval:    QRS Duration: 95 QT Interval:  466 QTC Calculation: 458 R Axis:   42 Text Interpretation:  Sinus rhythm Borderline short PR interval Anteroseptal infarct, age indeterminate Lateral leads are also involved Confirmed by Blane Ohara [phone]) on 10/24/2017 5:49:50 PM   Radiology Dg Chest 1 View  Result Date: 10/24/2017 CLINICAL DATA:  Syncope with shortness of breath EXAM: CHEST  1 VIEW COMPARISON:  Dec 06, 2008 FINDINGS: There is a calcified granuloma in the right mid lung, stable. There is no edema or consolidation. The heart size and pulmonary vascularity are normal. No adenopathy. No bone lesions. IMPRESSION: Calcified granuloma right mid lung. No edema or consolidation. Stable cardiac silhouette. Electronically Signed   By: Bretta Bang III M.D.   On: 10/24/2017 18:05   Ct Head Code Stroke Wo Contrast`  Result Date: 10/24/2017 CLINICAL DATA:  Code stroke. Dizziness, aphasia, and bilateral weakness. EXAM: CT HEAD WITHOUT CONTRAST TECHNIQUE: Contiguous axial images were obtained from the base of the skull through the vertex without intravenous contrast. COMPARISON:  05/23/2011 FINDINGS: Brain: No evidence of acute infarction, hemorrhage, hydrocephalus, extra-axial collection or mass lesion/mass effect. Moderate patchy low-density in the cerebral white matter best attributed to chronic small vessel ischemia. Vascular: Atherosclerotic calcification.   No hyperdense vessel. Skull: Normal. Negative for fracture or focal lesion. Sinuses/Orbits: Bilateral cataract resection.  No acute finding. Other: These results were communicated to Dr. Laurence Slate at 7:50 pmon 4/13/2019by text page via the [facility] messaging system. ASPECTS [facility] Stroke Program Early CT Score) Not scored with this history. IMPRESSION: 1. No acute finding. 2. Moderate chronic small vessel ischemia. Electronically Signed   By: Marnee Spring M.D.   On: 10/24/2017 19:51    Procedures Procedures (including critical care time)  Medications Ordered in ED Medications  sodium chloride 0.9 % bolus 1,000 mL (1,000 mLs Intravenous New Bag/Given 10/24/17 1848)  ondansetron (ZOFRAN) injection 4 mg (4 mg Intravenous Given 10/24/17 1855)     Initial Impression / Assessment and Plan / ED Course  I have reviewed the triage vital signs and the nursing notes.  Pertinent labs & imaging results that were available during my care of the patient were reviewed by me and considered in my medical decision making (see chart for details).     Patricia Orozco is a 72 year old female with history of fibromyalgia, depression who presents to the ED with near syncopal events.  Patient with overall unremarkable vitals.  No fever.  Patient with multiple episodes of dizziness, diaphoresis, near syncope over the last several days.  Patient states that she feels palpitations and possibly some chest pain during these events.  Last event happened this morning she felt like she was going to pass out but did not.  She felt like her heart rate was high. Patient does not have any known cholesterol issues or diabetes.  She does have hypertension.  EKG done in triage shows T wave inversions in V1 through V6 concerning for ischemia.  Otherwise there is no prior EKG to compare to.  Patient appears neurologically intact.  Exam is overall unremarkable.  Will obtain troponin, x-ray  of her chest and urinalysis and basic labs.   Concern for arrhythmia versus ACS versus electrolyte abnormality.  Patient with normal vitals.  Has a history of anxiety and used to be on benzodiazepines but patient states that she has not been on any.  Patient with chest x-ray that showed no signs of pneumonia, pneumothorax, pleural effusion.  Troponin within normal limits.  No signs of urinary tract infection.  No significant anemia or electrolyte abnormality.  No leukocytosis.  Patient with no episodes of arrhythmias while in the ED.  Patient however did have a acute episode of aphasia and extremity weakness.  Concern for stroke and a code stroke was called.  Patient had a head CT that was overall unremarkable.  Neurology recommends getting an MRI but after further evaluation suspect likely some anxiety component to these episodes.  However, given EKG findings will admit the patient for MRI, further cardiac monitoring, echocardiogram likely.  Patient remained hemodynamically stable throughout my care.  Final Clinical Impressions(s) / ED Diagnoses   Final diagnoses:  Near syncope  Dizziness  Weakness    ED Discharge Orders    None       Virgina Norfolk, DO 10/24/17 1745    Virgina Norfolk, DO 10/24/17 2055    Blane Ohara, MD 10/25/17 [phone]

## 2017-10-24 NOTE — ED Notes (Signed)
RN Aram BeechamCynthia called CT and pt is being moved to top of list.

## 2017-10-24 NOTE — ED Notes (Signed)
CT notified of Code stroke called

## 2017-10-24 NOTE — H&P (Signed)
History and Physical    Patricia DoveSandra L Orozco XLK:[id number] DOB: 1946-04-03 DOA: 10/24/2017  PCP: Corinna CapraBrown, Angel, DO   Patient coming from: Home  Chief Complaint: Near-syncope   HPI: Patricia DoveSandra L Orozco is a 72 y.o. female with medical history significant for depression with anxiety, hypertension, and fibromyalgia, now presenting to the emergency department for evaluation of near syncopal episodes.  Patient is accompanied by family who assist with the history.  She reportedly been in her usual state until approximately 1 week ago when she had an episode of near syncope.  She has had increasingly frequent episodes since that, including multiple today.  These episodes have occurred while seated and while lying down and are described as diaphoresis, followed by acute dyspnea, and then lightheadedness as though she is about to pass out.  She has not lost consciousness with these episodes.  There is no recent fall or trauma reported.  She denies headache, change in vision or hearing, or focal numbness or weakness.  ED Course: Upon arrival to the ED, patient is found to be afebrile, saturating well on room air, and with vitals otherwise stable.  EKG features a sinus rhythm with anterolateral ST-T abnormalities.  Noncontrast head CT is negative for acute intracranial abnormality.  Chest x-ray features a calcified granuloma in the right mid lung, but otherwise negative.  CBC is notable for mild leukocytosis and polycythemia.  Urinalysis is unremarkable and troponin is normal.  Chemistry panel features a creatinine of 1.35, up from 0.90 last October.  Patient was given a liter of normal saline and Zofran in the ED.  While in the ED, she had an acute episode of expressive aphasia, describing that she was trying to talk but was unable to say anything.  Code stroke was called and she was evaluated by neurology.  She remains hemodynamically stable, and no apparent respiratory distress, the acute aphasia has resolved, and she  will be observed on telemetry unit.  Review of Systems:  All other systems reviewed and apart from HPI, are negative.  Past Medical History:  Diagnosis Date  . Colon polyp   . Depression   . Endometriosis    stage 4  . Fibromyalgia   . Hypertension   . Insomnia   . Migraines     Past Surgical History:  Procedure Laterality Date  . ABDOMINAL HYSTERECTOMY  1979   endometrosis  . APPENDECTOMY    . BLADDER SUSPENSION  2004  . COLONOSCOPY    . OOPHORECTOMY       reports that she has never smoked. She has never used smokeless tobacco. She reports that she does not drink alcohol or use drugs.  Allergies  Allergen Reactions  . Celebrex [Celecoxib] Itching    Family History  Problem Relation Age of Onset  . Hypertension Mother   . Heart disease Mother      Prior to Admission medications   Medication Sig Start Date End Date Taking? Authorizing Provider  Calcium-Magnesium-Vitamin D (CALCIUM MAGNESIUM PO) Take by mouth.      [provider]  ClonazePAM (KLONOPIN PO) Take by mouth as needed.      [provider]  DULoxetine (CYMBALTA) 20 MG capsule Take 60 mg by mouth daily.     [provider]  Omega-3 Fatty Acids (FISH OIL) 1000 MG CAPS Take by mouth 2 (two) times daily.      [provider]  OVER THE COUNTER MEDICATION Baby asprin 81mg      [provider]  SUMAtriptan Succinate (IMITREX PO) Take by mouth.      [provider]  vitamin E (VITAMIN E) 400 UNIT capsule Take 400 Units by mouth 2 (two) times daily.      [provider]  zolpidem (AMBIEN) 10 MG tablet Take 10 mg by mouth at bedtime as needed.      [provider]    Physical Exam: Vitals:   10/24/17 1900 10/24/17 1905 10/24/17 1915 10/24/17 2100  BP:  (!) 160/64 (!) 155/73 (!) 154/82  Pulse: (!) 58 (!) 58 61 68  Resp: 16 16 19  (!) 24  Temp:      TempSrc:      SpO2: 100% 100% 100% 98%      Constitutional: NAD, anxious Eyes:  PERTLA, lids and conjunctivae normal ENMT: Mucous membranes are moist. Posterior pharynx clear of any exudate or lesions.   Neck: normal, supple, no masses, no thyromegaly Respiratory: clear to auscultation bilaterally, no wheezing, no crackles. Normal respiratory effort.    Cardiovascular: S1 & S2 heard, regular rate and rhythm. No significant JVD. Abdomen: No distension, no tenderness, soft. Bowel sounds normal.  Musculoskeletal: no clubbing / cyanosis. No joint deformity upper and lower extremities.    Skin: no significant rashes, lesions, ulcers. Warm, dry, well-perfused. Neurologic: CN 2-12 grossly intact. Sensation intact, patellar DTR normal. Strength 5/5 in all 4 limbs.  Psychiatric: Alert and oriented x 3. Anxious, cooperative.     Labs on Admission: I have personally reviewed following labs and imaging studies  CBC: Recent Labs  Lab 10/24/17 1751  WBC 11.9*  NEUTROABS 7.5  HGB 16.2*  HCT 45.9  MCV 90.2  PLT 233   Basic Metabolic Panel: Recent Labs  Lab 10/24/17 1751  NA 134*  K 3.7  CL 100*  CO2 22  GLUCOSE 96  BUN 29*  CREATININE 1.35*  CALCIUM 9.4   GFR: CrCl cannot be calculated (Unknown ideal weight.). Liver Function Tests: Recent Labs  Lab 10/24/17 1751  AST 30  ALT 32  ALKPHOS 73  BILITOT 0.8  PROT 6.1*  ALBUMIN 3.5   No results for input(s): LIPASE, AMYLASE in the last 168 hours. No results for input(s): AMMONIA in the last 168 hours. Coagulation Profile: No results for input(s): INR, PROTIME in the last 168 hours. Cardiac Enzymes: No results for input(s): CKTOTAL, CKMB, CKMBINDEX, TROPONINI in the last 168 hours. BNP (last 3 results) No results for input(s): PROBNP in the last 8760 hours. HbA1C: No results for input(s): HGBA1C in the last 72 hours. CBG: Recent Labs  Lab 10/24/17 2006  GLUCAP 67   Lipid Profile: No results for input(s): CHOL, HDL, LDLCALC, TRIG, CHOLHDL, LDLDIRECT in the last 72 hours. Thyroid Function Tests: No  results for input(s): TSH, T4TOTAL, FREET4, T3FREE, THYROIDAB in the last 72 hours. Anemia Panel: No results for input(s): VITAMINB12, FOLATE, FERRITIN, TIBC, IRON, RETICCTPCT in the last 72 hours. Urine analysis:    Component Value Date/Time   COLORURINE YELLOW 10/24/2017 1948   APPEARANCEUR HAZY (A) 10/24/2017 1948   LABSPEC 1.011 10/24/2017 1948   PHURINE 7.0 10/24/2017 1948   GLUCOSEU NEGATIVE 10/24/2017 1948   HGBUR NEGATIVE 10/24/2017 1948   BILIRUBINUR NEGATIVE 10/24/2017 1948   KETONESUR 20 (A) 10/24/2017 1948   PROTEINUR NEGATIVE 10/24/2017 1948   NITRITE NEGATIVE 10/24/2017 1948   LEUKOCYTESUR SMALL (A) 10/24/2017 1948   Sepsis Labs: @LABRCNTIP (procalcitonin:4,lacticidven:4) )No results found for this or any previous visit (from the past 240 hour(s)).   Radiological Exams on Admission:  Dg Chest 1 View  Result Date: 10/24/2017 CLINICAL DATA:  Syncope with shortness of breath EXAM: CHEST  1 VIEW COMPARISON:  Dec 06, 2008 FINDINGS: There is a calcified granuloma in the right mid lung, stable. There is no edema or consolidation. The heart size and pulmonary vascularity are normal. No adenopathy. No bone lesions. IMPRESSION: Calcified granuloma right mid lung. No edema or consolidation. Stable cardiac silhouette. Electronically Signed   By: Bretta Bang III M.D.   On: 10/24/2017 18:05   Ct Head Code Stroke Wo Contrast`  Result Date: 10/24/2017 CLINICAL DATA:  Code stroke. Dizziness, aphasia, and bilateral weakness. EXAM: CT HEAD WITHOUT CONTRAST TECHNIQUE: Contiguous axial images were obtained from the base of the skull through the vertex without intravenous contrast. COMPARISON:  05/23/2011 FINDINGS: Brain: No evidence of acute infarction, hemorrhage, hydrocephalus, extra-axial collection or mass lesion/mass effect. Moderate patchy low-density in the cerebral white matter best attributed to chronic small vessel ischemia. Vascular: Atherosclerotic calcification.  No hyperdense  vessel. Skull: Normal. Negative for fracture or focal lesion. Sinuses/Orbits: Bilateral cataract resection.  No acute finding. Other: These results were communicated to Dr. Laurence Slate at 7:50 pmon 4/13/2019by text page via the [facility] messaging system. ASPECTS [facility] Stroke Program Early CT Score) Not scored with this history. IMPRESSION: 1. No acute finding. 2. Moderate chronic small vessel ischemia. Electronically Signed   By: Marnee Spring M.D.   On: 10/24/2017 19:51    EKG: Independently reviewed. Sinus rhythm, anterolateral ST-T abnormality; no prior available for comparison, though report in care everywhere describes "consider anterolateral ischemia" from October 2018.   Assessment/Plan   1. Near-syncope  - Presents with increasingly frequent episodes of near-syncope over the past week, preceded by diaphoresis, SOB, and nausea; has occurred while seated and lying down  - No focal neurologic deficit identified on admission, non-contrast head CT is negative  - Continue cardiac monitoring, check orthostatic vitals, check echocardiogram, MRI brain given transient neuro deficit in ED    2. Expressive aphasia  - Developed acute-onset of expressive aphasia in ED and neurology was consulted; sxs resolved within a few minutes  - Head CT is negative for acute abnormality   - No focal neurologic deficit identified at time of admission  - Continue cardiac monitoring, check MRI brain, follow-up on neurology recommendations    3. Depression with anxiety  - Reports she is no longer taking Klonopin  - Continue Cymbalta    4. Renal insufficiency    - SCr is 1.35 on admission, similar to 2010, but noted to have been 0.90 in October 2018 in Care Everywhere  - Suspect this is acute prerenal azotemia given clinical hypovolemia  - Hydrate with NS, renally-dose medications, avoid nephrotoxins, repeat chem panel in am    DVT prophylaxis: sq heparin  Code Status: Full  Family Communication: Husband and  daughter updated at bedside Consults called: Neurology  Admission status: Observation    Briscoe Deutscher, MD [facility] Pager [phone]  If 7PM-7AM, please contact night-coverage [email or web address] Password [facility]  10/24/2017, 9:19 PM

## 2017-10-24 NOTE — ED Notes (Signed)
Hospitalist at bedside 

## 2017-10-24 NOTE — Consult Note (Signed)
Requesting Physician: Dr. Jodi Mourning    Chief Complaint: Aphasia  History obtained from: Patient and Chart     HPI:                                                                                                                                       Patricia Orozco is an 72 y.o. female past medical history significant for hypertension, fibromyalgia, anxiety and depressionwho presents to the emergency room after a near syncopal episode. Apparently she has been having similar episodes since the last week-  Starts off by eating diaphoretic, having palpitations, feeling lightheaded and almost passing out.  Today she complained of numbness that began in both feet that gradually went up and the numbness and tingling of both hands..  While in the emergency room, she stopped talking and therefore code stroke alert was called.  On assessment, no obvious facial weakness noted, she appeared weak all over poor effort on examination. After attempting to talk to her or a few minutes, she slowly whispered her name. She also stated the month correctly as April. A stat CT head was performed which was negative for acute abnormality. Patient's symptoms improved and later she did state she did not feel like talking.  Family concerned and states that her prior episodes of anxiety never presented in such fashion. She also complained of pain in her back.   Date last known well: 4.13.19 Time last known well: ? 7 pm tPA Given: no, symptoms improved, low suspicion for stroke     Past Medical History:  Diagnosis Date  . Colon polyp   . Depression   . Endometriosis    stage 4  . Fibromyalgia   . Hypertension   . Insomnia   . Migraines     Past Surgical History:  Procedure Laterality Date  . ABDOMINAL HYSTERECTOMY  1979   endometrosis  . APPENDECTOMY    . BLADDER SUSPENSION  2004  . COLONOSCOPY    . OOPHORECTOMY      Family History  Problem Relation Age of Onset  . Hypertension Mother   . Heart  disease Mother    Social History:  reports that she has never smoked. She has never used smokeless tobacco. She reports that she does not drink alcohol or use drugs.  Allergies:  Allergies  Allergen Reactions  . Cefdinir Other (See Comments)    unknown  . Celebrex [Celecoxib] Itching  . Celexa [Citalopram] Other (See Comments)    unknown  . Sudafed [Pseudoephedrine Hcl] Rash    Medications:  I reviewed home medications   ROS:                                                                                                                                     14 systems reviewed and negative except above    Examination:                                                                                                      General: Appears well-developed and well-nourished.  Psych: Affect appropriate to situation Eyes: No scleral injection HENT: No OP obstrucion Head: Normocephalic.  Cardiovascular: Normal rate and regular rhythm.  Respiratory: Effort normal and breath sounds normal to anterior ascultation GI: Soft.  No distension. There is no tenderness.  Skin: WDI   Neurological Examination Mental Status: Alert, oriented, thought content appropriate.  Speech fluent without evidence of aphasia. Able to follow 3 step commands without difficulty. Cranial Nerves: II: Visual fields grossly normal,  III,IV, VI: ptosis not present, extra-ocular motions intact bilaterally, pupils equal, round, reactive to light and accommodation V,VII: smile symmetric, facial light touch sensation normal bilaterally VIII: hearing normal bilaterally IX,X: uvula rises symmetrically XI: bilateral shoulder shrug XII: midline tongue extension Motor: Right : Upper extremity   5/5    Left:     Upper extremity   5/5  Lower extremity   5/5     Lower extremity   5/5 Tone and bulk:normal  tone throughout; no atrophy noted Sensory: Pinprick and light touch intact throughout, bilaterally Deep Tendon Reflexes: 2+ and symmetric throughout Plantars: Right: downgoing   Left: downgoing Cerebellar: normal finger-to-nose, normal rapid alternating movements and normal heel-to-shin test Gait: normal gait and station     Lab Results: Basic Metabolic Panel: Recent Labs  Lab 10/24/17 1751  NA 134*  K 3.7  CL 100*  CO2 22  GLUCOSE 96  BUN 29*  CREATININE 1.35*  CALCIUM 9.4    CBC: Recent Labs  Lab 10/24/17 1751  WBC 11.9*  NEUTROABS 7.5  HGB 16.2*  HCT 45.9  MCV 90.2  PLT 233    Coagulation Studies: No results for input(s): LABPROT, INR in the last 72 hours.  Imaging: Dg Chest 1 View  Result Date: 10/24/2017 CLINICAL DATA:  Syncope with shortness of breath EXAM: CHEST  1 VIEW COMPARISON:  Dec 06, 2008 FINDINGS: There is a calcified granuloma in the right mid lung, stable. There is no edema or consolidation. The heart size and pulmonary vascularity are normal. No adenopathy.  No bone lesions. IMPRESSION: Calcified granuloma right mid lung. No edema or consolidation. Stable cardiac silhouette. Electronically Signed   By: Bretta Bang III M.D.   On: 10/24/2017 18:05   Mr Brain Wo Contrast  Result Date: 10/24/2017 CLINICAL DATA:  Recurrent dizziness, nausea, palpitations and near syncope for several days. History of hypertension, migraines. EXAM: MRI HEAD WITHOUT CONTRAST TECHNIQUE: Multiplanar, multiecho pulse sequences of the brain and surrounding structures were obtained without intravenous contrast. COMPARISON:  CT HEAD October 24, 2017 FINDINGS: INTRACRANIAL CONTENTS: No reduced diffusion to suggest acute ischemia. No susceptibility artifact to suggest hemorrhage. The ventricles and sulci are normal for patient's age. Patchy supratentorial white matter FLAIR T2 hyperintensities. No suspicious parenchymal signal, masses, mass effect. No abnormal extra-axial fluid  collections. No extra-axial masses. VASCULAR: Normal major intracranial vascular flow voids present at skull base. SKULL AND UPPER CERVICAL SPINE: No abnormal sellar expansion. No suspicious calvarial bone marrow signal. Craniocervical junction maintained. SINUSES/ORBITS: Bilateral mastoid effusions. Paranasal sinus are well aerated.The included ocular globes and orbital contents are non-suspicious. Status post bilateral ocular lens implants. OTHER: None. IMPRESSION: 1. No acute intracranial process. 2. Moderate chronic small vessel ischemic changes. Electronically Signed   By: Awilda Metro M.D.   On: 10/24/2017 23:15   Ct Head Code Stroke Wo Contrast`  Result Date: 10/24/2017 CLINICAL DATA:  Code stroke. Dizziness, aphasia, and bilateral weakness. EXAM: CT HEAD WITHOUT CONTRAST TECHNIQUE: Contiguous axial images were obtained from the base of the skull through the vertex without intravenous contrast. COMPARISON:  05/23/2011 FINDINGS: Brain: No evidence of acute infarction, hemorrhage, hydrocephalus, extra-axial collection or mass lesion/mass effect. Moderate patchy low-density in the cerebral white matter best attributed to chronic small vessel ischemia. Vascular: Atherosclerotic calcification.  No hyperdense vessel. Skull: Normal. Negative for fracture or focal lesion. Sinuses/Orbits: Bilateral cataract resection.  No acute finding. Other: These results were communicated to Dr. Laurence Slate at 7:50 pmon 4/13/2019by text page via the [facility] messaging system. ASPECTS [facility] Stroke Program Early CT Score) Not scored with this history. IMPRESSION: 1. No acute finding. 2. Moderate chronic small vessel ischemia. Electronically Signed   By: Marnee Spring M.D.   On: 10/24/2017 19:51     ASSESSMENT AND PLAN  72 y.o. female past medical history significant for hypertension, fibromyalgia, anxiety and depressionwho presents to the emergency room after a near syncopal episode. She had transient episode where she was  not speaking. Not associated with  Unilateral weakness, exam findings. Suspect anxiety, however diagnosis of occlusion/ MRI brian was obtained which was negative for stroke.   Episode of not speaking, likely effort related. Multiple somatic complaints Near Syncopal Episodes   Low suspicion that this was stroke or seizures. MRI brain was obtained and negative for stroke. Can obtain EEG.   Complete workup for syncope.  F/U with outpatient neurology if symptoms reoccur.    Sushanth Aroor [facility] Pager Number [phone]

## 2017-10-24 NOTE — ED Notes (Signed)
ED Provider at bedside. 

## 2017-10-24 NOTE — ED Notes (Signed)
Neurohospitalist at bedside 

## 2017-10-24 NOTE — ED Notes (Signed)
Patient transported to MRI 

## 2017-10-25 ENCOUNTER — Observation Stay (HOSPITAL_BASED_OUTPATIENT_CLINIC_OR_DEPARTMENT_OTHER): Payer: Medicare HMO

## 2017-10-25 DIAGNOSIS — R42 Dizziness and giddiness: Secondary | ICD-10-CM

## 2017-10-25 DIAGNOSIS — R4701 Aphasia: Secondary | ICD-10-CM | POA: Diagnosis not present

## 2017-10-25 DIAGNOSIS — F418 Other specified anxiety disorders: Secondary | ICD-10-CM | POA: Diagnosis not present

## 2017-10-25 DIAGNOSIS — I5030 Unspecified diastolic (congestive) heart failure: Secondary | ICD-10-CM

## 2017-10-25 DIAGNOSIS — R55 Syncope and collapse: Secondary | ICD-10-CM | POA: Diagnosis not present

## 2017-10-25 DIAGNOSIS — N289 Disorder of kidney and ureter, unspecified: Secondary | ICD-10-CM | POA: Diagnosis not present

## 2017-10-25 LAB — BASIC METABOLIC PANEL
Anion gap: 8 (ref 5–15)
BUN: 21 mg/dL — ABNORMAL HIGH (ref 6–20)
CHLORIDE: 108 mmol/L (ref 101–111)
CO2: 23 mmol/L (ref 22–32)
CREATININE: 1.02 mg/dL — AB (ref 0.44–1.00)
Calcium: 8.5 mg/dL — ABNORMAL LOW (ref 8.9–10.3)
GFR calc Af Amer: >60 mL/min (ref >60)
GFR, EST NON AFRICAN AMERICAN: 54 mL/min — AB (ref >60)
GLUCOSE: 110 mg/dL — AB (ref 65–99)
POTASSIUM: 3.9 mmol/L (ref 3.5–5.1)
SODIUM: 139 mmol/L (ref 135–145)

## 2017-10-25 LAB — CBC
HEMATOCRIT: 42.4 % (ref 36.0–46.0)
Hemoglobin: 14.3 g/dL (ref 12.0–15.0)
MCH: 31.2 pg (ref 26.0–34.0)
MCHC: 33.7 g/dL (ref 30.0–36.0)
MCV: 92.6 fL (ref 78.0–100.0)
PLATELETS: 214 10*3/uL (ref 150–400)
RBC: 4.58 MIL/uL (ref 3.87–5.11)
RDW: 13 % (ref 11.5–15.5)
WBC: 10.4 10*3/uL (ref 4.0–10.5)

## 2017-10-25 LAB — ECHOCARDIOGRAM COMPLETE
Height: 64 in
Weight: 2992 oz

## 2017-10-25 LAB — LIPID PANEL
CHOL/HDL RATIO: 2.9 ratio
Cholesterol: 166 mg/dL (ref 0–200)
HDL: 58 mg/dL (ref >40)
LDL CALC: 91 mg/dL (ref 0–99)
TRIGLYCERIDES: 85 mg/dL (ref <150)
VLDL: 17 mg/dL (ref 0–40)

## 2017-10-25 LAB — HEMOGLOBIN A1C
HEMOGLOBIN A1C: 6 % — AB (ref 4.8–5.6)
Mean Plasma Glucose: 125.5 mg/dL

## 2017-10-25 LAB — GLUCOSE, CAPILLARY: Glucose-Capillary: 114 mg/dL — ABNORMAL HIGH (ref 65–99)

## 2017-10-25 MED ORDER — PERFLUTREN LIPID MICROSPHERE
1.0000 mL | INTRAVENOUS | Status: AC | PRN
  Administered 2017-10-25: 2 mL via INTRAVENOUS
  Filled 2017-10-25: qty 10

## 2017-10-25 MED ORDER — CLONAZEPAM 0.5 MG PO TABS: 0.2500 mg | ORAL_TABLET | Freq: Two times a day (BID) | ORAL | Status: DC | PRN

## 2017-10-25 MED ORDER — CLONAZEPAM 0.5 MG PO TABS: 0.2500 mg | ORAL_TABLET | Freq: Two times a day (BID) | ORAL | 0 refills | Status: AC | PRN

## 2017-10-25 NOTE — Evaluation (Signed)
Physical Therapy Evaluation and Discharge Patient Details Name: Patricia Orozco MRN: [id number] DOB: [birth year] Today's Date: 10/25/2017   History of Present Illness  72 y.o. female past medical history significant for hypertension, fibromyalgia, anxiety and depressionwho presents to the emergency room after a near syncopal episode. Apparently she has been having similar episodes since the last week-  Starts off by eating diaphoretic, having palpitations, feeling lightheaded and almost passing out. CT and MRI brain negative. EEG pending.   Clinical Impression  Patient evaluated by Physical Therapy with no further acute PT needs identified. Patient was asymptomatic during session and completed balance testing independently. PT is signing off. Thank you for this referral.     Follow Up Recommendations No PT follow up    Equipment Recommendations  None recommended by PT    Recommendations for Other Services       Precautions / Restrictions Precautions Precautions: Fall      Mobility  Bed Mobility Overal bed mobility: Modified Independent                Transfers Overall transfer level: Modified independent Equipment used: None                Ambulation/Gait Ambulation/Gait assistance: Supervision Ambulation Distance (Feet): 110 Feet Assistive device: None Gait Pattern/deviations: Wide base of support     General Gait Details: with head turns left and right (multiple) pt with no LOB or dizziness  Stairs            Wheelchair Mobility    Modified Rankin (Stroke Patients Only)       Balance Overall balance assessment: Independent                       Rhomberg - Eyes Opened: 15 Rhomberg - Eyes Closed: 15                 Pertinent Vitals/Pain Pain Assessment: No/denies pain    Home Living Family/patient expects to be discharged to:: Private residence Living Arrangements: Spouse/significant other                     Prior Function Level of Independence: Independent               Hand Dominance        Extremity/Trunk Assessment   Upper Extremity Assessment Upper Extremity Assessment: Overall WFL for tasks assessed    Lower Extremity Assessment Lower Extremity Assessment: Overall WFL for tasks assessed       Communication   Communication: No difficulties  Cognition Arousal/Alertness: Lethargic(reports did not sleep well and just wants to sleep) Behavior During Therapy: Flat affect Overall Cognitive Status: Within Functional Limits for tasks assessed                                        General Comments General comments (skin integrity, edema, etc.): Husband present thorughout    Exercises     Assessment/Plan    PT Assessment Patent does not need any further PT services  PT Problem List         PT Treatment Interventions      PT Goals (Current goals can be found in the Care Plan section)  Acute Rehab PT Goals Patient Stated Goal: find out what's going on and go home PT Goal Formulation: All assessment and education complete, DC therapy  Frequency     Barriers to discharge        Co-evaluation               AM-PAC PT "6 Clicks" Daily Activity  Outcome Measure Difficulty turning over in bed (including adjusting bedclothes, sheets and blankets)?: None Difficulty moving from lying on back to sitting on the side of the bed? : None Difficulty sitting down on and standing up from a chair with arms (e.g., wheelchair, bedside commode, etc,.)?: None Help needed moving to and from a bed to chair (including a wheelchair)?: None Help needed walking in hospital room?: None Help needed climbing 3-5 steps with a railing? : None 6 Click Score: 24    End of Session   Activity Tolerance: Patient tolerated treatment well Patient left: in bed;with call bell/phone within reach;with family/visitor present(bed alarm was not on )   PT Visit Diagnosis:  Dizziness and giddiness (R42)    Time: [id number] PT Time Calculation (min) (ACUTE ONLY): 12 min   Charges:   PT Evaluation $PT Eval Low Complexity: 1 Low     PT G Codes:          [employer] Sciences CorporationLynn P Remmi Armenteros, PT 10/25/2017, 11:18 AM

## 2017-10-25 NOTE — Progress Notes (Signed)
[facility]                                                                              Patient Demographics  Patricia Orozco, is a 72 y.o. female, DOB - 09/26/1945, ZOX:[id number]  Admit date - 10/24/2017   Admitting Physician Briscoe Deutscher, MD  Outpatient Primary MD for the patient is Corinna Capra, DO  Outpatient specialists:   LOS - 0  days   Medical records reviewed and are as summarized below:    Chief Complaint  Patient presents with  . Near Syncope       Brief summary    [name] is a 72 y.o. female with medical history significant for depression with anxiety, hypertension, and fibromyalgia, presented to ED with recurrent near syncopal episodes.  Per patient she had been in her usual state of health until approximately 1 week ago when she had an episode of near syncope and since then she has been having increasingly frequent episodes.  Patient reports that these episodes occurred while seated and while lying down and described as diaphoresis followed by acute dyspnea, lightheadedness as though she is about to pass out.  She has not lost consciousness with these episodes.   EKG showed sinus rhythm with nonspecific anterolateral ST-T wave abnormalities.  Code stroke was called and patient was evaluated by neurology who did not feel patient had any strokelike features CT head was negative.  Neurology felt patient could have possibly anxiety attacks.  Assessment & Plan    Principal Problem: Recurrent near syncope -Unclear etiology, evaluated by neurology who did not feel patient had any stroke.  CT head code stroke was negative.  MRI of the Patricia showed no acute intracranial process. -Troponin x1-, EKG showed T wave inversions from V2 to V6.  2D echo showed EF of 65-70% with no regional wall motion abnormalities, grade 1 diastolic dysfunction -EEG pending.  Neurology recommended further workup outpatient. -PT evaluation shows patient back to baseline    -Mild acute kidney injury at the time of admission possibly from dehydration.  UA showed ketones otherwise no UTI -Patient has a history of lumbar spine DJD, following [facility] neurosurgery -Patient will benefit from Holter monitor outpatient to rule out any arrhythmias  Active Problems:   Depression with anxiety -Continue Cymbalta, will benefit from Klonopin as needed for anxiety  Acute kidney injury -Likely prerenal, UA showed ketones, clinical hypovolemia at the time of admission Creatinine 1.35 at the time of admission, gently hydrated, improved to 1.0  Code Status: Full CODE STATUS DVT Prophylaxis: Heparin subcu Family Communication: Discussed in detail with the patient, all imaging results, lab results explained to the patient and husband at bedside   Disposition Plan: Awaiting EEG results Time Spent in minutes   25 minutes  Procedures:  MRI Patricia, 2D echo  Consultants:   Neurology  Antimicrobials:      Medications  Scheduled Meds: . aspirin  81 mg Oral Daily  . DULoxetine  60 mg Oral Daily  . heparin  5,000 Units Subcutaneous Q8H  . omega-3 acid ethyl esters  1 g Oral BID  . sodium chloride flush  3 mL Intravenous Q12H   Continuous Infusions: PRN Meds:.acetaminophen **OR** acetaminophen, clonazePAM, HYDROcodone-acetaminophen, ondansetron **OR** ondansetron (ZOFRAN) IV, zolpidem   Antibiotics   Anti-infectives (From admission, onward)   None        Subjective:   Patricia Orozco was seen and examined today.  No similar episodes since overnight.  Denies any chest pain, palpitations, dizziness or lightheadedness. Patient denies shortness of breath, abdominal pain, N/V/D/C, new weakness, numbess, tingling. No acute events overnight.    Objective:   Vitals:   10/24/17 2331 10/24/17 2335 10/24/17 2354 10/25/17 0551  BP: (!) 148/69  (!) 141/80 127/61  Pulse: 65  63 66  Resp: 16   18  Temp: 98.3 F (36.8 C)   98.4 F (36.9 C)  TempSrc: Oral   Oral   SpO2: 95%  96% 93%  Weight:  85.2 kg (187 lb 13.3 oz)  84.8 kg (187 lb)  Height:  5\' 4"  (1.626 m)      Intake/Output Summary (Last 24 hours) at 10/25/2017 1408 Last data filed at 10/24/2017 1949 Gross per 24 hour  Intake 999.99 ml  Output -  Net 999.99 ml     Wt Readings from Last 3 Encounters:  10/25/17 84.8 kg (187 lb)  03/12/11 73.9 kg (163 lb)     Exam  General: Alert and oriented x 3, NAD  Eyes:  HEENT:  Atraumatic, normocephalic  Cardiovascular: S1 S2 auscultated, no rubs, murmurs or gallops. Regular rate and rhythm.  Respiratory: Clear to auscultation bilaterally, no wheezing, rales or rhonchi  Gastrointestinal: Soft, nontender, nondistended, + bowel sounds  Ext: no pedal edema bilaterally  Neuro: AAOx3, Cr N's II- XII. Strength 5/5 upper and lower extremities bilaterally, speech clear,   Musculoskeletal: No digital cyanosis, clubbing  Skin: No rashes  Psych: Normal affect and demeanor, alert and oriented x3    Data Reviewed:  I have personally reviewed following labs and imaging studies  Micro Results No results found for this or any previous visit (from the past 240 hour(s)).  Radiology Reports Dg Chest 1 View  Result Date: 10/24/2017 CLINICAL DATA:  Syncope with shortness of breath EXAM: CHEST  1 VIEW COMPARISON:  Dec 06, 2008 FINDINGS: There is a calcified granuloma in the right mid lung, stable. There is no edema or consolidation. The heart size and pulmonary vascularity are normal. No adenopathy. No bone lesions. IMPRESSION: Calcified granuloma right mid lung. No edema or consolidation. Stable cardiac silhouette. Electronically Signed   By: Bretta BangWilliam  Woodruff III M.D.   On: 10/24/2017 18:05   Mr Patricia Orozco Contrast  Result Date: 10/24/2017 CLINICAL DATA:  Recurrent dizziness, nausea, palpitations and near syncope for several days. History of hypertension, migraines. EXAM: MRI HEAD WITHOUT CONTRAST TECHNIQUE: Multiplanar, multiecho pulse sequences of  the Patricia and surrounding structures were obtained without intravenous contrast. COMPARISON:  CT HEAD October 24, 2017 FINDINGS: INTRACRANIAL CONTENTS: No reduced diffusion to suggest acute ischemia. No susceptibility artifact to suggest hemorrhage. The ventricles and sulci are normal for patient's age. Patchy supratentorial white matter FLAIR T2 hyperintensities. No suspicious parenchymal signal, masses, mass effect. No abnormal extra-axial fluid collections. No extra-axial masses. VASCULAR: Normal major intracranial vascular flow voids present at skull base. SKULL AND UPPER CERVICAL SPINE: No abnormal sellar expansion. No suspicious calvarial bone marrow signal. Craniocervical junction maintained. SINUSES/ORBITS: Bilateral mastoid effusions. Paranasal sinus are well aerated.The included ocular globes and orbital contents are non-suspicious. Status post bilateral ocular lens implants. OTHER: None. IMPRESSION: 1. No acute intracranial process. 2. Moderate  chronic small vessel ischemic changes. Electronically Signed   By: Awilda Metro M.D.   On: 10/24/2017 23:15   Ct Head Code Stroke Orozco Contrast`  Result Date: 10/24/2017 CLINICAL DATA:  Code stroke. Dizziness, aphasia, and bilateral weakness. EXAM: CT HEAD WITHOUT CONTRAST TECHNIQUE: Contiguous axial images were obtained from the base of the skull through the vertex without intravenous contrast. COMPARISON:  05/23/2011 FINDINGS: Patricia: No evidence of acute infarction, hemorrhage, hydrocephalus, extra-axial collection or mass lesion/mass effect. Moderate patchy low-density in the cerebral white matter best attributed to chronic small vessel ischemia. Vascular: Atherosclerotic calcification.  No hyperdense vessel. Skull: Normal. Negative for fracture or focal lesion. Sinuses/Orbits: Bilateral cataract resection.  No acute finding. Other: These results were communicated to Dr. Laurence Slate at 7:50 pmon 4/13/2019by text page via the [facility] messaging system. ASPECTS  [facility] Stroke Program Early CT Score) Not scored with this history. IMPRESSION: 1. No acute finding. 2. Moderate chronic small vessel ischemia. Electronically Signed   By: Marnee Spring M.D.   On: 10/24/2017 19:51    Lab Data:  CBC: Recent Labs  Lab 10/24/17 1751 10/25/17 0306  WBC 11.9* 10.4  NEUTROABS 7.5  --   HGB 16.2* 14.3  HCT 45.9 42.4  MCV 90.2 92.6  PLT 233 214   Basic Metabolic Panel: Recent Labs  Lab 10/24/17 1751 10/25/17 0306  NA 134* 139  K 3.7 3.9  CL 100* 108  CO2 22 23  GLUCOSE 96 110*  BUN 29* 21*  CREATININE 1.35* 1.02*  CALCIUM 9.4 8.5*   GFR: Estimated Creatinine Clearance: 52.5 mL/min (A) (by C-G formula based on SCr of 1.02 mg/dL (H)). Liver Function Tests: Recent Labs  Lab 10/24/17 1751  AST 30  ALT 32  ALKPHOS 73  BILITOT 0.8  PROT 6.1*  ALBUMIN 3.5   No results for input(s): LIPASE, AMYLASE in the last 168 hours. No results for input(s): AMMONIA in the last 168 hours. Coagulation Profile: No results for input(s): INR, PROTIME in the last 168 hours. Cardiac Enzymes: No results for input(s): CKTOTAL, CKMB, CKMBINDEX, TROPONINI in the last 168 hours. BNP (last 3 results) No results for input(s): PROBNP in the last 8760 hours. HbA1C: Recent Labs    10/25/17 0306  HGBA1C 6.0*   CBG: Recent Labs  Lab 10/24/17 2006 10/25/17 0556  GLUCAP 67 114*   Lipid Profile: Recent Labs    10/25/17 0306  CHOL 166  HDL 58  LDLCALC 91  TRIG 85  CHOLHDL 2.9   Thyroid Function Tests: No results for input(s): TSH, T4TOTAL, FREET4, T3FREE, THYROIDAB in the last 72 hours. Anemia Panel: No results for input(s): VITAMINB12, FOLATE, FERRITIN, TIBC, IRON, RETICCTPCT in the last 72 hours. Urine analysis:    Component Value Date/Time   COLORURINE YELLOW 10/24/2017 1948   APPEARANCEUR HAZY (A) 10/24/2017 1948   LABSPEC 1.011 10/24/2017 1948   PHURINE 7.0 10/24/2017 1948   GLUCOSEU NEGATIVE 10/24/2017 1948   HGBUR NEGATIVE 10/24/2017  1948   BILIRUBINUR NEGATIVE 10/24/2017 1948   KETONESUR 20 (A) 10/24/2017 1948   PROTEINUR NEGATIVE 10/24/2017 1948   NITRITE NEGATIVE 10/24/2017 1948   LEUKOCYTESUR SMALL (A) 10/24/2017 1948     Ripudeep Rai M.D. [facility] 10/25/2017, 2:08 PM  Pager: [phone] Between 7am to 7pm - call Pager - [phone]  After 7pm go to [email or web address] - password [email or web address]  Call night coverage person covering after 7pm

## 2017-10-25 NOTE — Progress Notes (Signed)
  Echocardiogram 2D Echocardiogram has been performed.  Patricia SavoyCasey Orozco Patricia Orozco 10/25/2017, 10:29 AM

## 2017-10-25 NOTE — Progress Notes (Signed)
Pt. And family had new concern of change in color of feet. This RN assessment revealed feet maroon colored, pulses + 2 in dorsalis pedis, cap refill less than 2 and feet warm. No complaints of pain in feet. MD paged. Will continue to monitor.

## 2017-10-26 ENCOUNTER — Observation Stay (HOSPITAL_COMMUNITY): Payer: Medicare HMO

## 2017-10-26 DIAGNOSIS — R4701 Aphasia: Secondary | ICD-10-CM | POA: Diagnosis not present

## 2017-10-26 DIAGNOSIS — F418 Other specified anxiety disorders: Secondary | ICD-10-CM | POA: Diagnosis not present

## 2017-10-26 DIAGNOSIS — R55 Syncope and collapse: Principal | ICD-10-CM

## 2017-10-26 DIAGNOSIS — R42 Dizziness and giddiness: Secondary | ICD-10-CM | POA: Diagnosis not present

## 2017-10-26 LAB — GLUCOSE, CAPILLARY: GLUCOSE-CAPILLARY: 84 mg/dL (ref 65–99)

## 2017-10-26 LAB — TSH: TSH: 1.923 u[IU]/mL (ref 0.350–4.500)

## 2017-10-26 LAB — VITAMIN B12: Vitamin B-12: 330 pg/mL (ref 180–914)

## 2017-10-26 LAB — FOLATE: Folate: 35.5 ng/mL (ref >5.9)

## 2017-10-26 MED ORDER — MONTELUKAST SODIUM 10 MG PO TABS: 10.0000 mg | ORAL_TABLET | Freq: Every day | ORAL | Status: DC

## 2017-10-26 MED ORDER — B COMPLEX-C PO TABS
1.0000 | ORAL_TABLET | Freq: Every day | ORAL | Status: DC
  Administered 2017-10-26: 1 via ORAL
  Filled 2017-10-26: qty 1

## 2017-10-26 MED ORDER — LISINOPRIL 10 MG PO TABS: 10.0000 mg | ORAL_TABLET | Freq: Every evening | ORAL | Status: DC

## 2017-10-26 MED ORDER — ADULT MULTIVITAMIN W/MINERALS CH
1.0000 | ORAL_TABLET | Freq: Every day | ORAL | Status: DC
  Administered 2017-10-26: 1 via ORAL
  Filled 2017-10-26: qty 1

## 2017-10-26 MED ORDER — ASPIRIN EC 81 MG PO TBEC: 81.0000 mg | DELAYED_RELEASE_TABLET | Freq: Every day | ORAL | 3 refills | Status: AC

## 2017-10-26 MED ORDER — GABAPENTIN 100 MG PO CAPS: 200.0000 mg | ORAL_CAPSULE | Freq: Every day | ORAL | Status: DC

## 2017-10-26 MED ORDER — VITAMIN C 500 MG PO TABS
2000.0000 mg | ORAL_TABLET | Freq: Two times a day (BID) | ORAL | Status: DC
  Administered 2017-10-26: 2000 mg via ORAL
  Filled 2017-10-26: qty 4

## 2017-10-26 MED ORDER — TRAZODONE HCL 50 MG PO TABS: 25.0000 mg | ORAL_TABLET | Freq: Every day | ORAL | Status: DC

## 2017-10-26 NOTE — Discharge Summary (Signed)
Physician Discharge Summary   Patient ID: Patricia Orozco MRN: [id number] DOB/AGE: January 17, 1946 72 y.o.  Admit date: 10/24/2017 Discharge date: 10/26/2017  Primary Care Physician:  Corinna Capra, DO   Recommendations for Outpatient Follow-up:  1. Follow up with PCP in 1-2 weeks 2. Please obtain BMP/CBC in one week  Home Health: None  Equipment/Devices:   Discharge Condition: stable CODE STATUS: DNR  Diet recommendation: Heart healthy diet   Discharge Diagnoses:    . Depression with anxiety . Near syncope . Acute kidney injury   Consults: Neurology    Allergies:   Allergies  Allergen Reactions  . Cefdinir Other (See Comments)    unknown  . Celebrex [Celecoxib] Itching  . Celexa [Citalopram] Other (See Comments)    unknown  . Sudafed [Pseudoephedrine Hcl] Rash     DISCHARGE MEDICATIONS: Allergies as of 10/26/2017      Reactions   Cefdinir Other (See Comments)   unknown   Celebrex [celecoxib] Itching   Celexa [citalopram] Other (See Comments)   unknown   Sudafed [pseudoephedrine Hcl] Rash      Medication List    TAKE these medications   aspirin EC 81 MG tablet Take 1 tablet (81 mg total) by mouth daily.   B-complex with vitamin C tablet Take 1 tablet by mouth daily.   CALCIUM MAGNESIUM PO Take 2 tablets by mouth 2 (two) times daily.   clonazePAM 0.5 MG tablet Commonly known as:  KLONOPIN Take 0.5 tablets (0.25 mg total) by mouth 2 (two) times daily as needed (ANXIETY).   DULoxetine 20 MG capsule Commonly known as:  CYMBALTA Take 90 mg by mouth every morning.   Fish Oil 1000 MG Caps Take 1 capsule by mouth 2 (two) times daily.   gabapentin 100 MG capsule Commonly known as:  NEURONTIN Take 200 mg by mouth at bedtime.   lisinopril 10 MG tablet Commonly known as:  PRINIVIL,ZESTRIL Take 10 mg by mouth every evening.   montelukast 10 MG tablet Commonly known as:  SINGULAIR Take 10 mg by mouth at bedtime.   multivitamin with minerals Tabs  tablet Take 1 tablet by mouth daily.   traZODone 50 MG tablet Commonly known as:  DESYREL Take 25 mg by mouth at bedtime.   vitamin C 500 MG tablet Commonly known as:  ASCORBIC ACID Take 2,000 mg by mouth 2 (two) times daily.   vitamin E 400 UNIT capsule Generic drug:  vitamin E Take 400 Units by mouth daily.        Brief H and P: For complete details please refer to admission H and P, but in brief Patricia Laplante Merrittis a 72 y.o.femalewith medical history significant fordepression with anxiety, hypertension, and fibromyalgia, presented to ED with recurrent near syncopal episodes.  Per patient she had been in her usual state of health until approximately 1 week ago when she had an episode of near syncope and since then she has been having increasingly frequent episodes.  Patient reports that these episodes occurred while seated and while lying down and described as diaphoresis followed by acute dyspnea, lightheadedness as though she is about to pass out.  She has not lost consciousness with these episodes.   EKG showed sinus rhythm with nonspecific anterolateral ST-T wave abnormalities.  Code stroke was called and patient was evaluated by neurology who did not feel patient had any strokelike features CT head was negative.  Neurology felt patient could have possibly anxiety attacks.   Hospital Course:  Recurrent near syncope -Unclear  etiology, evaluated by neurology who did not feel patient had any stroke or neurological causes.  CT head code stroke was negative.  MRI of the brain showed no acute intracranial process. -Troponin x1 negative, EKG showed T wave inversions from V2 to V6.  2D echo showed EF of 65-70% with no regional wall motion abnormalities, grade 1 diastolic dysfunction -EEG was negative  for any seizure activity.  Neurology recommended further workup outpatient. -PT evaluation shows patient back to baseline  -Mild acute kidney injury at the time of admission possibly from  dehydration.  UA showed ketones otherwise no UTI -Patient has a history of lumbar spine DJD, following [facility] neurosurgery -Patient will benefit from Holter monitor outpatient to rule out any arrhythmias.  Negative orthostatics.  Discussed with neurology again, Dr Wilford Corner, who recommended further workup outpatient with neurology.  Ambulatory referral sent -Folate and B12, TSH all normal    Depression with anxiety -Continue Cymbalta, will benefit from Klonopin as needed for anxiety  Acute kidney injury -Likely prerenal, UA showed ketones, clinical hypovolemia at the time of admission Creatinine 1.35 at the time of admission -Patient was given IV fluid hydration, creatinine improved to 1.0 at the time of discharge.     Day of Discharge S: Headache otherwise no acute issues, no near syncopal episode since admission.  BP (!) 159/73 (BP Location: Left Arm)   Pulse 63   Temp 97.9 F (36.6 C)   Resp 16   Ht 5\' 4"  (1.626 m)   Wt 76.2 kg (168 lb) Comment: pt states this weight is her real weight  LMP 03/11/1978   SpO2 97%   BMI 28.84 kg/m   Physical Exam: General: Alert and awake oriented x3 not in any acute distress. HEENT: anicteric sclera, pupils reactive to light and accommodation CVS: S1-S2 clear no murmur rubs or gallops Chest: clear to auscultation bilaterally, no wheezing rales or rhonchi Abdomen: soft nontender, nondistended, normal bowel sounds Extremities: no cyanosis, clubbing or edema noted bilaterally Neuro: Cranial nerves II-XII intact, no focal neurological deficits   The results of significant diagnostics from this hospitalization (including imaging, microbiology, ancillary and laboratory) are listed below for reference.      Procedures/Studies:  Study Conclusions  - Left ventricle: The cavity size was normal. There was mild   concentric hypertrophy. Systolic function was vigorous. The   estimated ejection fraction was in the range of 65% to 70%. Wall    motion was normal; there were no regional wall motion   abnormalities. Doppler parameters are consistent with abnormal   left ventricular relaxation (grade 1 diastolic dysfunction).   Acoustic contrast opacification revealed no evidence ofthrombus. - Aortic valve: There was trivial regurgitation. - Pulmonary arteries: Systolic pressure was mildly increased. PA   peak pressure: 35 mm Hg (S).  Dg Chest 1 View  Result Date: 10/24/2017 CLINICAL DATA:  Syncope with shortness of breath EXAM: CHEST  1 VIEW COMPARISON:  Dec 06, 2008 FINDINGS: There is a calcified granuloma in the right mid lung, stable. There is no edema or consolidation. The heart size and pulmonary vascularity are normal. No adenopathy. No bone lesions. IMPRESSION: Calcified granuloma right mid lung. No edema or consolidation. Stable cardiac silhouette. Electronically Signed   By: Bretta Bang III M.D.   On: 10/24/2017 18:05   Mr Brain Wo Contrast  Result Date: 10/24/2017 CLINICAL DATA:  Recurrent dizziness, nausea, palpitations and near syncope for several days. History of hypertension, migraines. EXAM: MRI HEAD WITHOUT CONTRAST TECHNIQUE: Multiplanar, multiecho pulse  sequences of the brain and surrounding structures were obtained without intravenous contrast. COMPARISON:  CT HEAD October 24, 2017 FINDINGS: INTRACRANIAL CONTENTS: No reduced diffusion to suggest acute ischemia. No susceptibility artifact to suggest hemorrhage. The ventricles and sulci are normal for patient's age. Patchy supratentorial white matter FLAIR T2 hyperintensities. No suspicious parenchymal signal, masses, mass effect. No abnormal extra-axial fluid collections. No extra-axial masses. VASCULAR: Normal major intracranial vascular flow voids present at skull base. SKULL AND UPPER CERVICAL SPINE: No abnormal sellar expansion. No suspicious calvarial bone marrow signal. Craniocervical junction maintained. SINUSES/ORBITS: Bilateral mastoid effusions. Paranasal sinus  are well aerated.The included ocular globes and orbital contents are non-suspicious. Status post bilateral ocular lens implants. OTHER: None. IMPRESSION: 1. No acute intracranial process. 2. Moderate chronic small vessel ischemic changes. Electronically Signed   By: Awilda Metroourtnay  Bloomer M.D.   On: 10/24/2017 23:15   Ct Head Code Stroke Wo Contrast`  Result Date: 10/24/2017 CLINICAL DATA:  Code stroke. Dizziness, aphasia, and bilateral weakness. EXAM: CT HEAD WITHOUT CONTRAST TECHNIQUE: Contiguous axial images were obtained from the base of the skull through the vertex without intravenous contrast. COMPARISON:  05/23/2011 FINDINGS: Brain: No evidence of acute infarction, hemorrhage, hydrocephalus, extra-axial collection or mass lesion/mass effect. Moderate patchy low-density in the cerebral white matter best attributed to chronic small vessel ischemia. Vascular: Atherosclerotic calcification.  No hyperdense vessel. Skull: Normal. Negative for fracture or focal lesion. Sinuses/Orbits: Bilateral cataract resection.  No acute finding. Other: These results were communicated to Dr. Laurence SlateAroor at 7:50 pmon 4/13/2019by text page via the [facility] messaging system. ASPECTS [facility]([place] Stroke Program Early CT Score) Not scored with this history. IMPRESSION: 1. No acute finding. 2. Moderate chronic small vessel ischemia. Electronically Signed   By: Marnee SpringJonathon  Watts M.D.   On: 10/24/2017 19:51       LAB RESULTS: Basic Metabolic Panel: Recent Labs  Lab 10/24/17 1751 10/25/17 0306  NA 134* 139  K 3.7 3.9  CL 100* 108  CO2 22 23  GLUCOSE 96 110*  BUN 29* 21*  CREATININE 1.35* 1.02*  CALCIUM 9.4 8.5*   Liver Function Tests: Recent Labs  Lab 10/24/17 1751  AST 30  ALT 32  ALKPHOS 73  BILITOT 0.8  PROT 6.1*  ALBUMIN 3.5   No results for input(s): LIPASE, AMYLASE in the last 168 hours. No results for input(s): AMMONIA in the last 168 hours. CBC: Recent Labs  Lab 10/24/17 1751 10/25/17 0306  WBC 11.9* 10.4   NEUTROABS 7.5  --   HGB 16.2* 14.3  HCT 45.9 42.4  MCV 90.2 92.6  PLT 233 214   Cardiac Enzymes: No results for input(s): CKTOTAL, CKMB, CKMBINDEX, TROPONINI in the last 168 hours. BNP: Invalid input(s): POCBNP CBG: Recent Labs  Lab 10/25/17 0556 10/26/17 0509  GLUCAP 114* 84      Disposition and Follow-up: Discharge Instructions    Ambulatory referral to Neurology   Complete by:  As directed    An appointment is requested in approximately:2-3 WEEKS. Recurrent near-syncopal episodes, also complained about numbness and tingling in hand and feet. Patient concerned about TIA, stroke work-up negative.   Diet - low sodium heart healthy   Complete by:  As directed    Increase activity slowly   Complete by:  As directed        DISPOSITION:home   DISCHARGE FOLLOW-UP Follow-up Information    [facility]. Schedule an appointment as soon as possible for a visit in 2 week(s).   Specialty:  Neurology Why:  referral has been sent, please call to confirm appointment Contact information: [address] [place] [address] [phone]       Corinna Capra, DO. Schedule an appointment as soon as possible for a visit in 2 week(s).   Specialty:  Family Medicine Contact information: [address] [place] [address] [phone]            Time coordinating discharge:    Signed:   Thad Ranger M.D. [facility] 10/26/2017, 12:56 PM Pager: [phone]

## 2017-10-26 NOTE — Procedures (Signed)
ELECTROENCEPHALOGRAM REPORT  Date of Study: 10/26/2017  Patient's Name: Patricia Orozco MRN: [id number] Date of Birth: September 27, 1945  Referring Provider: Thad Rangeripudeep Rai, MD  Clinical History: 72 year old woman with recurrent syncope  Medications: Cymbalta Hydrocodone-acetaminophen  Technical Summary: A multichannel digital EEG recording measured by the international 10-20 system with electrodes applied with paste and impedances below 5000 ohms performed in our laboratory with EKG monitoring in an awake patient.  Hyperventilation was not performed.  Photic stimulation was performed.  The digital EEG was referentially recorded, reformatted, and digitally filtered in a variety of bipolar and referential montages for optimal display.    Description: The patient is awake during the recording.  During maximal wakefulness, there is a symmetric, medium voltage 10 Hz posterior dominant rhythm that attenuates with eye opening.  The record is symmetric.  Stage 2 sleep is not seen.  Photic stimulation did not elicit any abnormalities.  There were no epileptiform discharges or electrographic seizures seen.    EKG lead was unremarkable.  Impression: This awake EEG is normal.    Clinical Correlation: A normal EEG does not exclude a clinical diagnosis of epilepsy.  If further clinical questions remain, prolonged EEG may be helpful.  Clinical correlation is advised.   Shon MilletAdam Jaffe, DO

## 2017-10-26 NOTE — Progress Notes (Signed)
Patricia DoveSandra L Coffel to be D/C'd Home per MD order.  Discussed with the patient and all questions fully answered.  VSS, Skin clean, dry and intact without evidence of skin break down, no evidence of skin tears noted. IV catheter discontinued intact. Site without signs and symptoms of complications. Dressing and pressure applied.  An After Visit Summary was printed and given to the patient. Patient received prescription.  D/c education completed with patient/family including follow up instructions, medication list, d/c activities limitations if indicated, with other d/c instructions as indicated by MD - patient able to verbalize understanding, all questions fully answered.   Patient instructed to return to ED, call 911, or call MD for any changes in condition.   Volunteers called to take pt to exit and D/C home via private auto.  Grayling Congressvan J Jameka Ivie 10/26/2017 12:39 PM

## 2017-10-26 NOTE — Progress Notes (Signed)
EEG Completed; Results Pending  

## 2017-10-29 ENCOUNTER — Encounter: Payer: Self-pay | Admitting: Neurology

## 2017-10-29 ENCOUNTER — Ambulatory Visit: Payer: Medicare HMO | Admitting: Neurology

## 2017-10-29 DIAGNOSIS — R55 Syncope and collapse: Secondary | ICD-10-CM | POA: Diagnosis not present

## 2017-10-29 NOTE — Patient Instructions (Addendum)
Vasovagal Syncope, Adult Syncope, which is commonly known as fainting or passing out, is a temporary loss of consciousness. It occurs when the blood flow to the brain is reduced. Vasovagal syncope, also called neurocardiogenic syncope, is a fainting spell that happens when blood flow to the brain is reduced because of a sudden drop in heart rate and blood pressure. Vasovagal syncope is usually harmless. However, you can get injured if you fall during a fainting spell. What are the causes? This condition is caused by a drop in heart rate and blood pressure, usually in response to a trigger. Many things and situations can trigger an episode, including:  Pain.  Fear.  The sight of blood. This may occur during medical procedures, such as when blood is being drawn from a vein.  Common activities, such as coughing, swallowing, stretching, or going to the bathroom.  Emotional stress.  Being in a confined space.  Prolonged standing, especially in a warm environment.  Lack of sleep or rest.  Not eating for a long time.  Not drinking enough liquids.  Recent illness.  Drinking alcohol.  Taking drugs that affect blood pressure, such as marijuana, cocaine, opiates, or inhalants.  What are the signs or symptoms? Before a fainting episode, you may:  Feel dizzy or light-headed.  Become pale.  Sense that you are going to faint.  Feel like the room is spinning.  Only see directly ahead (tunnel vision).  Feel sick to your stomach (nauseous).  See spots.  Slowly lose vision.  Hear ringing in your ears.  Have a headache.  Feel warm and sweaty.  Feel a sensation of pins and needles.  During the fainting spell, you may twitch or make jerky movements. Fainting spells usually last no longer than a few minutes before you wake up. If you get up too quickly before your body can recover, you may faint again. How is this diagnosed? This condition is diagnosed based on your symptoms,  your medical history, and a physical exam. Tests may be done to rule out other causes of fainting. Tests may include:  Blood tests.  Heart tests, such as an electrocardiogram (ECG), echocardiogram, or electrophysiology study.  A test to check your response to changes in position (tilt table test).  How is this treated? Usually, treatment is not needed for this condition. Your health care provider may suggest ways to help prevent fainting episodes. These may include:  Drinking additional fluids if you are exposed to a trigger.  Sitting or lying down if you notice signs that an episode is coming.  If your fainting spells continue, your health care provider may recommend that you:  Take medicines to prevent fainting or to help reduce further episodes of fainting.  Do certain exercises.  Wear compression stockings.  Have surgery to place a pacemaker in your body (rare).  Follow these instructions at home:  Learn to identify the signs that an episode is coming.  Sit or lie down at the first sign of a fainting spell. If you sit down, put your head down between your legs. If you lie down, swing your legs up in the air to increase blood flow to the brain.  Avoid hot tubs and saunas.  Avoid standing for a long time. If you have to stand for a long time, try: ? Crossing your legs. ? Flexing and stretching your leg muscles. ? Squatting. ? Moving your legs. ? Bending over.  Drink enough fluid to keep your urine clear or pale  yellow.  Make changes to your diet that your health care provider recommends. You may be told to: ? Avoid caffeine. ? Eat more salt.  Take over-the-counter and prescription medicines only as told by your health care provider. Contact a health care provider if:  You continue to have fainting spells despite treatment.  You faint more often despite treatment.  You lose consciousness for more than a few minutes.  You faint during or after exercising or  after being startled.  You have twitching or jerky movements for longer than a few seconds during a fainting spell.  You have an episode of twitching or jerky movements without fainting. Get help right away if:  A fainting spell leads to an injury or bleeding.  You have new symptoms that occur with the fainting spells, such as: ? Shortness of breath. ? Chest pain. ? Irregular heartbeat.  You twitch or make jerky movements for more than 5 minutes.  You twitch or make jerky movements during more than one fainting spell. This information is not intended to replace advice given to you by your health care provider. Make sure you discuss any questions you have with your health care provider. Document Released: 06/16/2012 Document Revised: 12/12/2015 Document Reviewed: 04/28/2015 Elsevier Interactive Patient Education  2018 [employer]  More information on Vasovagal syncope  Vasovagal syncope-One of the most common types of syncope is called vasovagal syncope, which is the most common cause of reflex syncope. A variety of conditions can trigger vasovagal syncope, including physical or psychological stress, dehydration, bleeding, or pain. The heart rate may slow dramatically at the time of the faint, and the blood vessels (mainly the veins) in the body expand, causing blood to pool in the lower extremities and the bowels, resulting in less blood return to the heart and a low blood pressure (hypotension). This causes a decrease in blood flow to the brain. In some cases, vasovagal syncope is triggered by an emotional response to a stimulus, such as fear of injury, heat exposure, the sight of blood, or extreme pain. In other cases, it is caused by abnormal nervous system responses to activities such as urinating, having a bowel movement, coughing, or swallowing. In still other cases, no trigger can be identified. In most cases of vasovagal syncope, you have some warning that you are near fainting.  These signs include dizziness, feeling hot or cold, nausea, pale skin, "tunnel-like" vision, disturbance of hearing, and profuse sweating. After the episode, symptoms may continue because of continued low blood pressure. Some people feel extremely tired.

## 2017-10-29 NOTE — Addendum Note (Signed)
Addended by: Naomie DeanAHERN, Leiya Keesey B on: 10/29/2017 08:48 AM   Modules accepted: Orders, Level of Service

## 2017-10-29 NOTE — Progress Notes (Signed)
[facility]    Provider:  Dr Lucia Gaskins Referring Provider: Corinna Capra, DO Primary Care Physician:  Pearson Forster, MD  CC:  Pre-syncope  HPI:  Patricia Orozco is a 72 y.o. female here as a referral from Dr. Manson Passey for pre-syncope. PMHx chronic pain, anxiety, Here with her husband who also provides information. She has a PMHx of anxiety, fibromyalgia. In Nov of 2018 she had a colonoscopy, felt well, 7 days later she started bleeding and she passed out in the setting of bleeding. She was laying in a pool of blood per husband. She landed on her right side and she has been having trouble with her leg and saw a neurologist, testing showed pinched nerve and she had 2 injections which really helped. After the injections she started feeling bad, she starts with sweating on the head, then it gets so intense that she is drenched, she feels generally weak then palpitations. She lays down and feels better, unknown triggers. Happens 3-4 times a week. She feels like she is going to pass out, if she lays down and drinks something it goes away. No Fhx of seizures. No other focal neurologic deficits, associated symptoms, inciting events or modifiable factors.  Reviewed notes, labs and imaging from outside physicians, which showed:  EEG normal, reviewed report  Personally reviewed MRI and agree with the following: IMPRESSION: 1. No acute intracranial process. 2. Moderate chronic small vessel ischemic changes.    Review of Systems: Patient complains of symptoms per HPI as well as the following symptoms: back pain . Pertinent negatives and positives per HPI. All others negative.   Social History   Socioeconomic History  . Marital status: Married    Spouse name: Not on file  . Number of children: 1  . Years of education: Not on file  . Highest education level: High school graduate  Occupational History  . Not on file  Social Needs  . Financial resource strain: Not on file  .  Food insecurity:    Worry: Not on file    Inability: Not on file  . Transportation needs:    Medical: Not on file    Non-medical: Not on file  Tobacco Use  . Smoking status: Never Smoker  . Smokeless tobacco: Never Used  Substance and Sexual Activity  . Alcohol use: No  . Drug use: No  . Sexual activity: Never  Lifestyle  . Physical activity:    Days per week: Not on file    Minutes per session: Not on file  . Stress: Not on file  Relationships  . Social connections:    Talks on phone: Not on file    Gets together: Not on file    Attends religious service: Not on file    Active member of club or organization: Not on file    Attends meetings of clubs or organizations: Not on file    Relationship status: Not on file  . Intimate partner violence:    Fear of current or ex partner: Not on file    Emotionally abused: Not on file    Physically abused: Not on file    Forced sexual activity: Not on file  Other Topics Concern  . Not on file  Social History Narrative   Lives at home with her husband   Right handed   Caffeine: occasional    Family History  Problem Relation Age of Onset  . Hypertension Mother   . Heart disease Mother   .  Heart attack Mother     Past Medical History:  Diagnosis Date  . Cataracts, bilateral   . Colon polyp   . Depression   . Endometriosis    stage 4  . Fibromyalgia   . Hypertension   . Insomnia   . Migraines     Past Surgical History:  Procedure Laterality Date  . ABDOMINAL HYSTERECTOMY  1979   endometrosis  . APPENDECTOMY    . BLADDER SUSPENSION  2004  . CATARACT EXTRACTION, BILATERAL    . COLONOSCOPY    . OOPHORECTOMY      Current Outpatient Medications  Medication Sig Dispense Refill  . B Complex-C (B-COMPLEX WITH VITAMIN C) tablet Take 1 tablet by mouth daily.    . Calcium-Magnesium-Vitamin D (CALCIUM MAGNESIUM PO) Take 2 tablets by mouth 2 (two) times daily.     . DULoxetine (CYMBALTA) 30 MG capsule Take 30 mg by mouth  daily. Take with 60 mg for total of 90 mg daily    . DULoxetine (CYMBALTA) 60 MG capsule Take 60 mg by mouth daily. Take with 30 mg for total of 90 mg daily    . gabapentin (NEURONTIN) 300 MG capsule Take by mouth 3 (three) times daily. Take 2 capsules after breakfast, 2 capsules after lunch, and 3 capsules at bedtime    . lisinopril (PRINIVIL,ZESTRIL) 10 MG tablet Take 10 mg by mouth every evening.    . montelukast (SINGULAIR) 10 MG tablet Take 10 mg by mouth at bedtime.    . Multiple Vitamin (MULTIVITAMIN WITH MINERALS) TABS tablet Take 1 tablet by mouth daily.    . Omega-3 Fatty Acids (FISH OIL) 1000 MG CAPS Take 1 capsule by mouth 2 (two) times daily.     . traZODone (DESYREL) 50 MG tablet Take 50 mg by mouth at bedtime.     . vitamin C (ASCORBIC ACID) 500 MG tablet Take 500 mg by mouth 2 (two) times daily.     . vitamin E (VITAMIN E) 400 UNIT capsule Take 400 Units by mouth daily.     Marland Kitchen. aspirin EC 81 MG tablet Take 1 tablet (81 mg total) by mouth daily. (Patient not taking: Reported on 10/29/2017) 30 tablet 3  . clonazePAM (KLONOPIN) 0.5 MG tablet Take 0.5 tablets (0.25 mg total) by mouth 2 (two) times daily as needed (ANXIETY). (Patient not taking: Reported on 10/29/2017) 30 tablet 0   No current facility-administered medications for this visit.     Allergies as of 10/29/2017 - Review Complete 10/29/2017  Allergen Reaction Noted  . Cefdinir Other (See Comments) 10/24/2017  . Celebrex [celecoxib] Itching 05/23/2011  . Celexa [citalopram] Other (See Comments) 10/24/2017  . Sudafed [pseudoephedrine hcl] Rash 10/24/2017    Vitals: BP 112/61 (BP Location: Left Arm, Patient Position: Sitting)   Pulse 75   Ht 5\' 4"  (1.626 m)   Wt 171 lb (77.6 kg)   LMP 03/11/1978   BMI 29.35 kg/m  Last Weight:  Wt Readings from Last 1 Encounters:  10/29/17 171 lb (77.6 kg)   Last Height:   Ht Readings from Last 1 Encounters:  10/29/17 5\' 4"  (1.626 m)   Physical exam: Exam: Gen: NAD, conversant,  well nourised, obese, well groomed                     CV: RRR, no MRG. No Carotid Bruits. No peripheral edema, warm, nontender Eyes: Conjunctivae clear without exudates or hemorrhage  Neuro: Detailed Neurologic Exam  Speech:    Speech  is normal; fluent and spontaneous with normal comprehension.  Cognition:    The patient is oriented to person, place, and time;     recent and remote memory intact;     language fluent;     normal attention, concentration,     fund of knowledge Cranial Nerves:    The pupils are equal, round, and reactive to light. Attempted fundoscopic exam could not visualize. Visual fields are full to finger confrontation. Extraocular movements are intact. Trigeminal sensation is intact and the muscles of mastication are normal. The face is symmetric. The palate elevates in the midline. Hearing intact. Voice is normal. Shoulder shrug is normal. The tongue has normal motion without fasciculations.   Coordination:    Normal finger to nose and heel to shin. Normal rapid alternating movements.   Gait:    Not ataxic  Motor Observation:    No asymmetry, no atrophy, and no involuntary movements noted. Tone:    Normal muscle tone.    Posture:    Posture is normal. normal erect    Strength:    Strength is V/V in the upper and lower limbs.      Sensation: intact to LT     Reflex Exam:  DTR's:    Deep tendon reflexes in the upper and lower extremities are symmetrical bilaterally.   Toes:    The toes are equiv bilaterally.   Clonus:    Clonus is absent.       Assessment/Plan:  Patient with episodes of what sounds like vaso-vagal syncope.   Recommend pcp perform a thorough cardiac evaluation to ensure not cardiac such as a 30-day heart monitor or refer to cardiology for tilt table and monitoring.   Sh had a "TIA" recommend the following: I had a long d/w patient about her TIA, risk for recurrent stroke/TIAs, personally independently reviewed imaging studies  and stroke evaluation results and answered questions. ASA 81mg  for secondary stroke prevention and maintain strict control of hypertension with blood pressure goal below 130/90, diabetes with hemoglobin A1c goal below 6.5% and lipids with LDL cholesterol goal below 70 mg/dL. I also advised the patient to eat a healthy diet with plenty of whole grains, cereals, fruits and vegetables, exercise regularly and maintain ideal body weight .  Appears to be due to anxiety and chronic pain.   Do not drive at this time.  Cc: Dr. Nelle Don, MD  [facility] [address] [place], [place] [address]  Phone [phone] Fax [phone]

## 2017-10-30 ENCOUNTER — Telehealth: Payer: Self-pay | Admitting: Neurology

## 2017-10-30 LAB — CBC
HEMOGLOBIN: 15.2 g/dL (ref 11.1–15.9)
Hematocrit: 44.8 % (ref 34.0–46.6)
MCH: 31.4 pg (ref 26.6–33.0)
MCHC: 33.9 g/dL (ref 31.5–35.7)
MCV: 93 fL (ref 79–97)
Platelets: 214 10*3/uL (ref 150–379)
RBC: 4.84 x10E6/uL (ref 3.77–5.28)
RDW: 13.7 % (ref 12.3–15.4)
WBC: 8.2 10*3/uL (ref 3.4–10.8)

## 2017-10-30 LAB — COMPREHENSIVE METABOLIC PANEL
A/G RATIO: 1.7 (ref 1.2–2.2)
ALBUMIN: 3.8 g/dL (ref 3.5–4.8)
ALT: 29 IU/L (ref 0–32)
AST: 25 IU/L (ref 0–40)
Alkaline Phosphatase: 73 IU/L (ref 39–117)
BUN / CREAT RATIO: 26 (ref 12–28)
BUN: 31 mg/dL — ABNORMAL HIGH (ref 8–27)
Bilirubin Total: 0.3 mg/dL (ref 0.0–1.2)
CO2: 27 mmol/L (ref 20–29)
Calcium: 9.9 mg/dL (ref 8.7–10.3)
Chloride: 101 mmol/L (ref 96–106)
Creatinine, Ser: 1.17 mg/dL — ABNORMAL HIGH (ref 0.57–1.00)
GFR, EST AFRICAN AMERICAN: 54 mL/min/{1.73_m2} — AB (ref >59)
GFR, EST NON AFRICAN AMERICAN: 47 mL/min/{1.73_m2} — AB (ref >59)
GLOBULIN, TOTAL: 2.3 g/dL (ref 1.5–4.5)
Glucose: 68 mg/dL (ref 65–99)
POTASSIUM: 4.6 mmol/L (ref 3.5–5.2)
SODIUM: 141 mmol/L (ref 134–144)
TOTAL PROTEIN: 6.1 g/dL (ref 6.0–8.5)

## 2017-10-30 NOTE — Telephone Encounter (Signed)
-----   Message from Anson FretAntonia B Ahern, MD sent at 10/30/2017  9:35 AM EDT ----- She has some CKD but that is chronic and stable otherwise Labs normal

## 2017-10-30 NOTE — Telephone Encounter (Signed)
Called the pt and made her aware of her lab work. Pt verbalized understanding. Pt had no questions at this time but was encouraged to call back if questions arise.

## 2017-11-10 ENCOUNTER — Ambulatory Visit: Payer: Self-pay | Admitting: Neurology

## 2019-12-16 IMAGING — CT CT HEAD CODE STROKE
3 series · 14 of 47 positions shown, 16 images · non-contrast
Comparison: 05/23/2011

CLINICAL DATA: Code stroke. Dizziness, aphasia, and bilateral
weakness.

EXAM:
CT HEAD WITHOUT CONTRAST
TECHNIQUE: Contiguous axial images were obtained from the base of the skull
through the vertex without intravenous contrast.

[Series 4: head 5.0 st · axial · 0.42mm/px · z∈[-114,+26]mm · 8 of 34 slices shown, 10 images]
[im 3/34  brain]
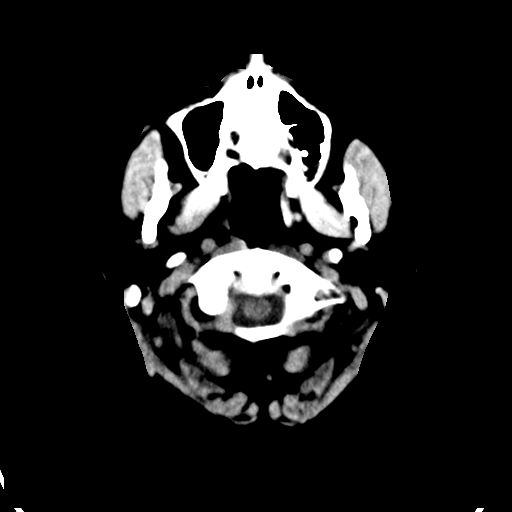
[im 3/34  bone]
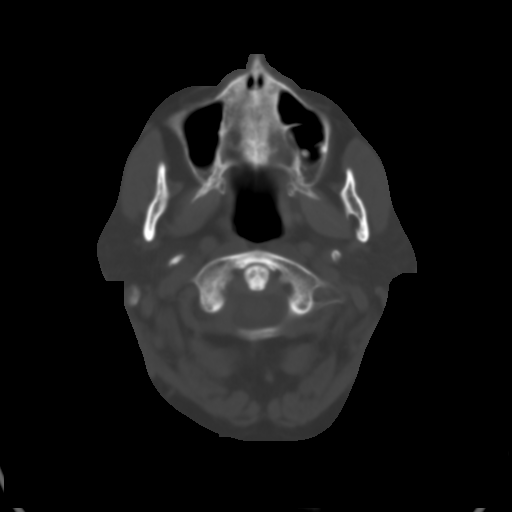
[im 7/34  brain]
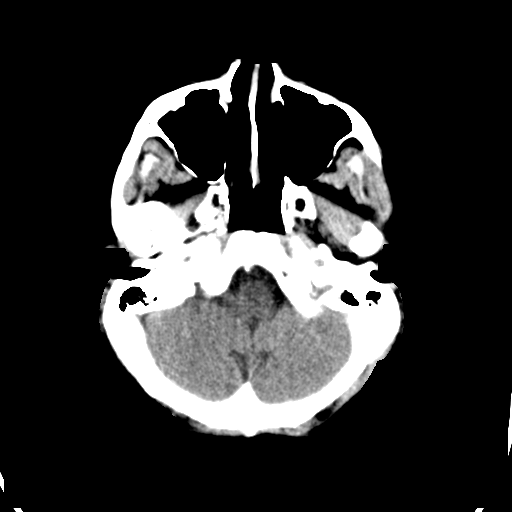
[im 11/34  brain]
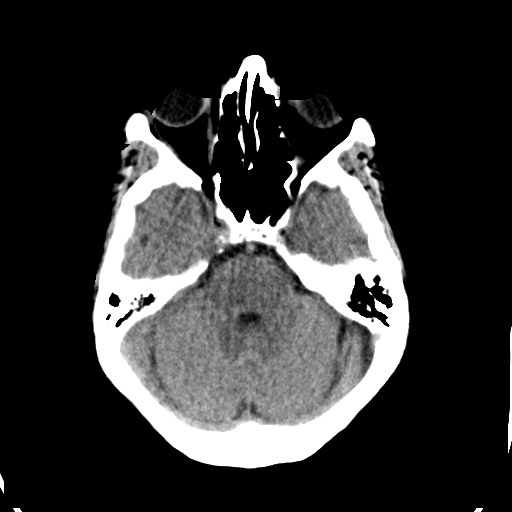
[im 15/34  brain]
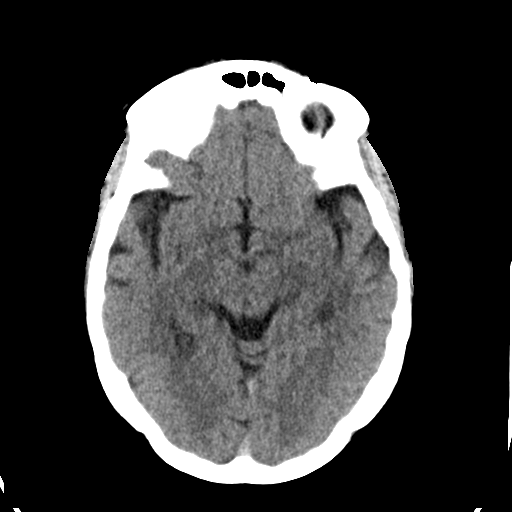
[im 19/34  brain]
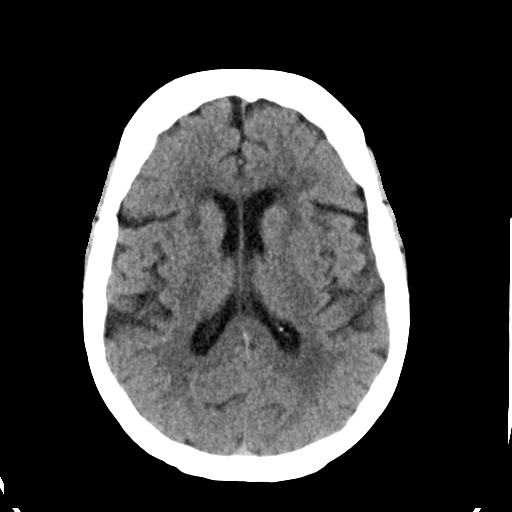
[im 19/34  bone]
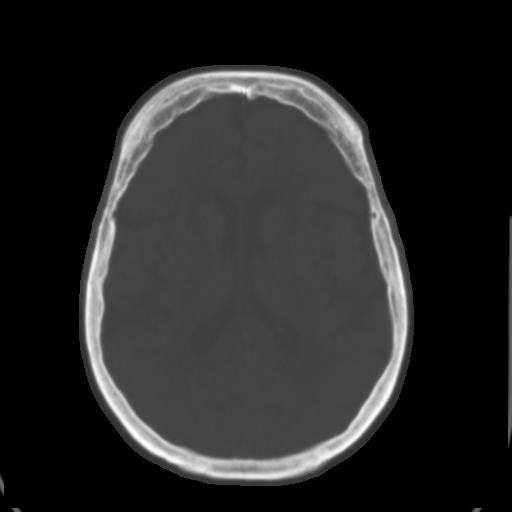
[im 23/34  brain]
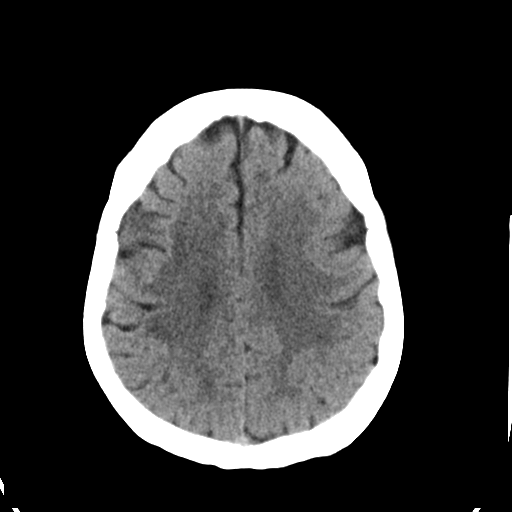
[im 27/34  brain]
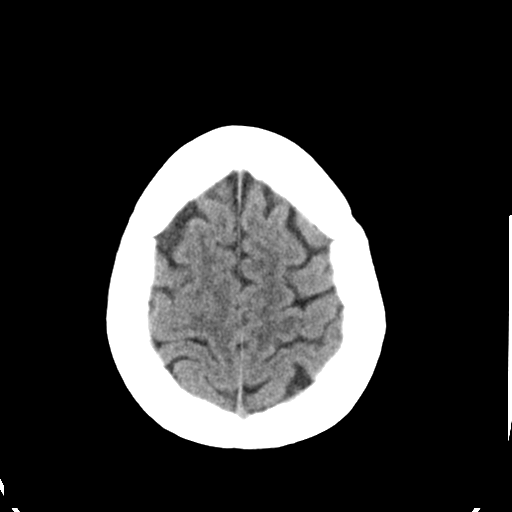
[im 31/34  brain]
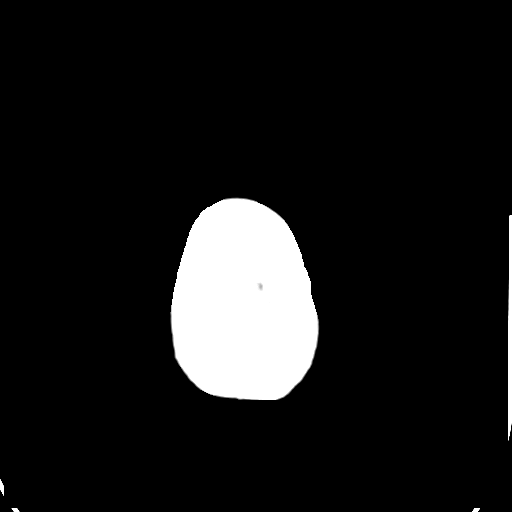

[Series 5: head 3.0 cor st · coronal · 0.33mm/px · 3 of 67 slices shown]
[im 23/67  brain]
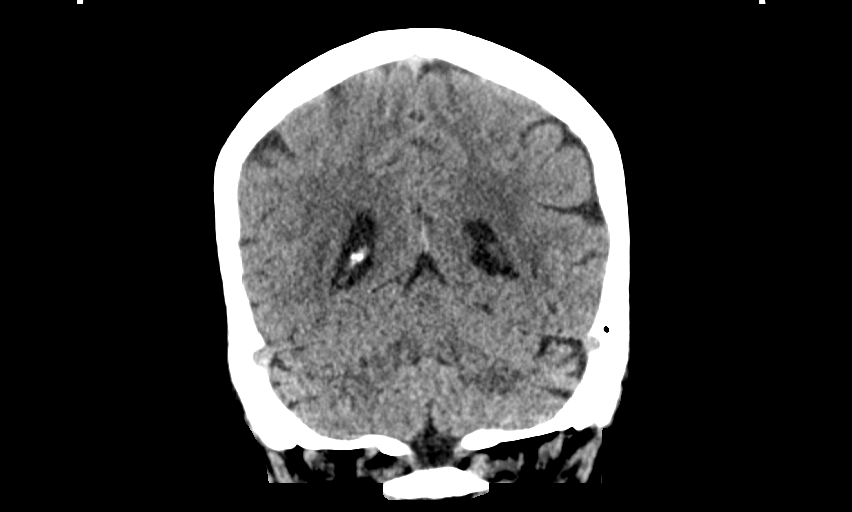
[im 30/67  brain]
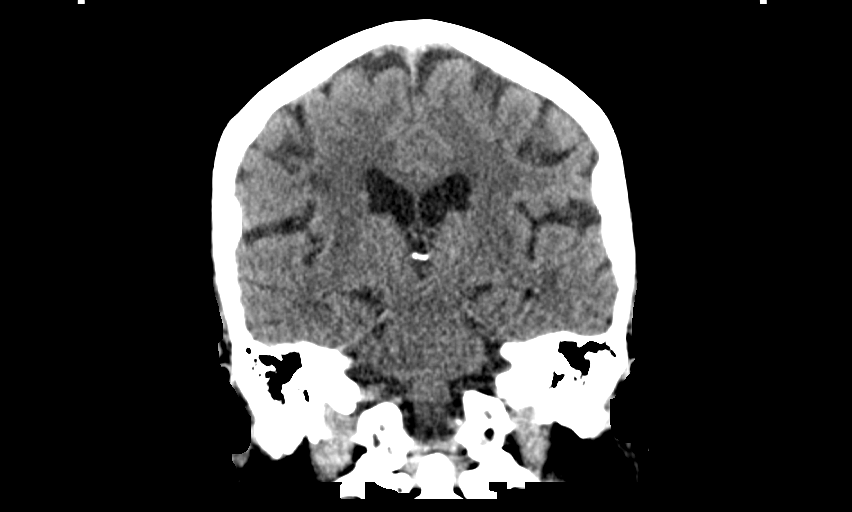
[im 37/67  brain]
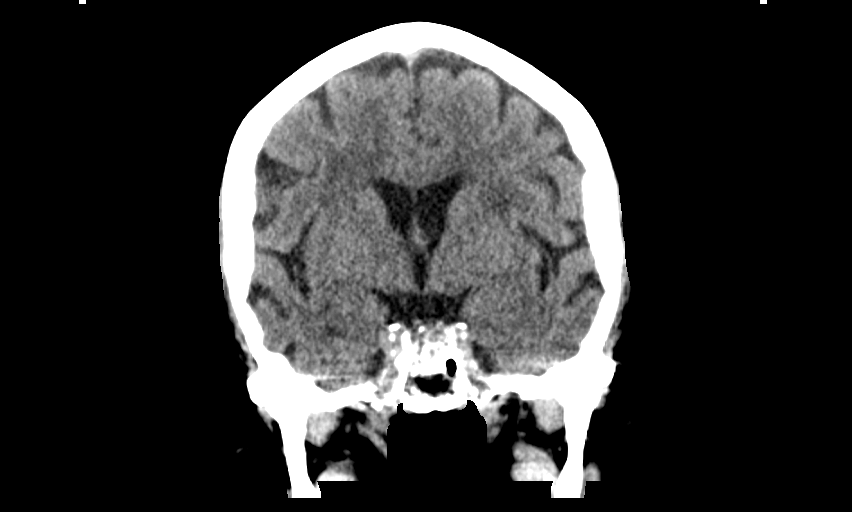

[Series 6: head 3.0 sag st · sagittal · 0.33mm/px · 3 of 64 slices shown]
[im 22/64  brain]
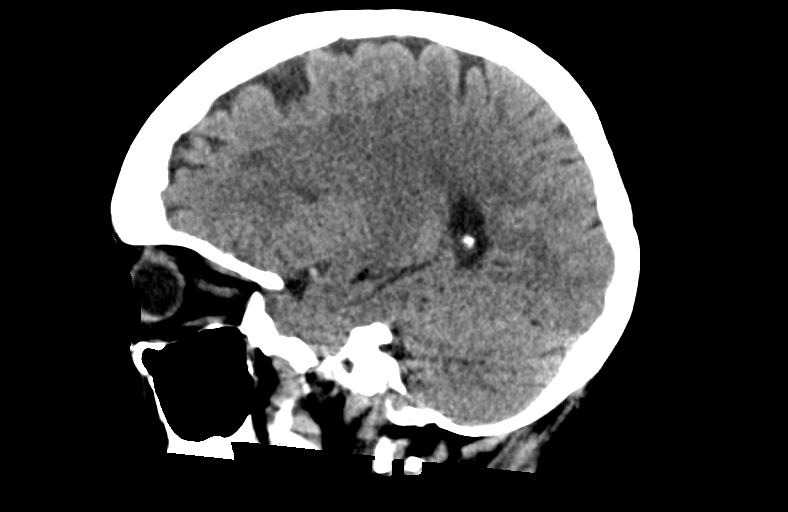
[im 32/64  brain]
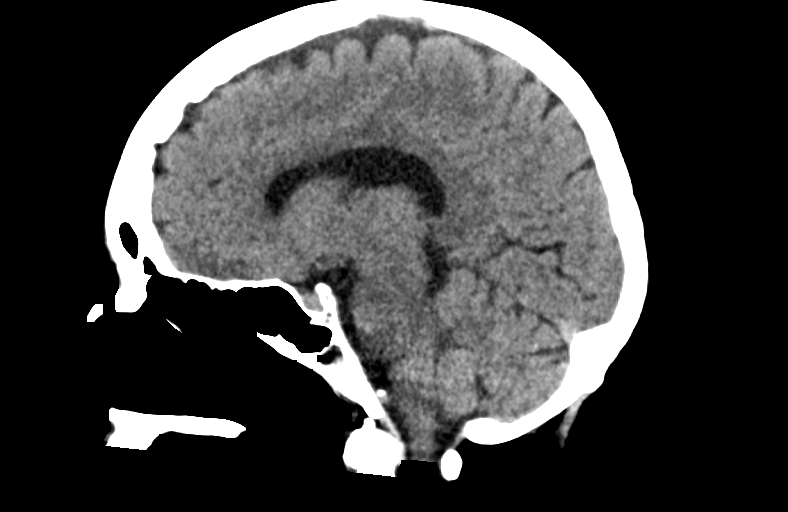
[im 43/64  brain]
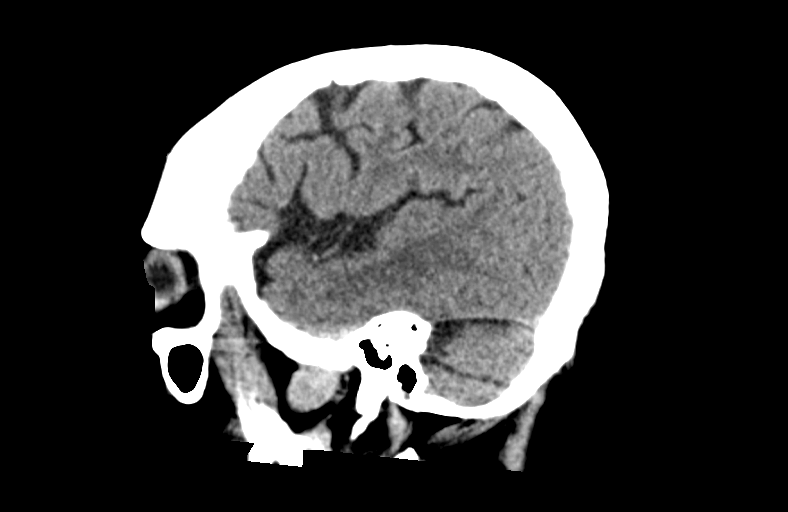

[14 of 47 positions shown; findings below may reference images not displayed]

FINDINGS: Brain: No evidence of acute infarction, hemorrhage, hydrocephalus,
extra-axial collection or mass lesion/mass effect. Moderate patchy
low-density in the cerebral white matter best attributed to chronic
small vessel ischemia.

Vascular: Atherosclerotic calcification.  No hyperdense vessel.

Skull: Normal. Negative for fracture or focal lesion.

Sinuses/Orbits: Bilateral cataract resection.  No acute finding.

Other: These results were communicated to Dr. Constantin at [DATE] pmon
10/24/2017by text page via the [employer] messaging system.

ASPECTS ([place] Stroke Program Early CT Score)

Not scored with this history.
IMPRESSION: 1. No acute finding.
2. Moderate chronic small vessel ischemia.
